# Patient Record
Sex: Female | Born: 1955 | Race: Black or African American | Hispanic: No | Marital: Single | Smoking: Former smoker
Health system: Southern US, Community
[De-identification: ages and names within clinical notes are randomized; demographics above are authoritative.]

## PROBLEM LIST (undated history)

## (undated) ENCOUNTER — Ambulatory Visit (HOSPITAL_COMMUNITY): Admission: EM

## (undated) DIAGNOSIS — T783XXA Angioneurotic edema, initial encounter: Secondary | ICD-10-CM

## (undated) DIAGNOSIS — L509 Urticaria, unspecified: Secondary | ICD-10-CM

## (undated) HISTORY — PX: BACK SURGERY: SHX140

## (undated) HISTORY — DX: Angioneurotic edema, initial encounter: T78.3XXA

## (undated) HISTORY — DX: Urticaria, unspecified: L50.9

---

## 1997-08-03 ENCOUNTER — Other Ambulatory Visit: Admission: RE | Admit: 1997-08-03 | Discharge: 1997-08-03 | Payer: Self-pay | Admitting: Obstetrics and Gynecology

## 1997-09-02 ENCOUNTER — Other Ambulatory Visit: Admission: RE | Admit: 1997-09-02 | Discharge: 1997-09-02 | Payer: Self-pay | Admitting: Obstetrics and Gynecology

## 1997-10-24 ENCOUNTER — Emergency Department (HOSPITAL_COMMUNITY): Admission: EM | Admit: 1997-10-24 | Discharge: 1997-10-24 | Payer: Self-pay | Admitting: Emergency Medicine

## 1998-04-09 ENCOUNTER — Encounter: Payer: Self-pay | Admitting: Neurosurgery

## 1998-04-09 ENCOUNTER — Ambulatory Visit (HOSPITAL_COMMUNITY): Admission: RE | Admit: 1998-04-09 | Discharge: 1998-04-09 | Payer: Self-pay | Admitting: Neurosurgery

## 1998-10-19 ENCOUNTER — Other Ambulatory Visit: Admission: RE | Admit: 1998-10-19 | Discharge: 1998-10-19 | Payer: Self-pay | Admitting: Gynecology

## 1999-10-28 ENCOUNTER — Other Ambulatory Visit: Admission: RE | Admit: 1999-10-28 | Discharge: 1999-10-28 | Payer: Self-pay | Admitting: Gynecology

## 2001-01-25 ENCOUNTER — Other Ambulatory Visit: Admission: RE | Admit: 2001-01-25 | Discharge: 2001-01-25 | Payer: Self-pay | Admitting: Gynecology

## 2002-01-15 ENCOUNTER — Encounter: Payer: Self-pay | Admitting: *Deleted

## 2002-01-15 ENCOUNTER — Encounter: Admission: RE | Admit: 2002-01-15 | Discharge: 2002-01-15 | Payer: Self-pay | Admitting: *Deleted

## 2005-11-07 ENCOUNTER — Other Ambulatory Visit: Admission: RE | Admit: 2005-11-07 | Discharge: 2005-11-07 | Payer: Self-pay | Admitting: Family Medicine

## 2005-11-18 ENCOUNTER — Emergency Department (HOSPITAL_COMMUNITY): Admission: EM | Admit: 2005-11-18 | Discharge: 2005-11-18 | Payer: Self-pay | Admitting: Emergency Medicine

## 2008-05-11 ENCOUNTER — Emergency Department (HOSPITAL_COMMUNITY): Admission: EM | Admit: 2008-05-11 | Discharge: 2008-05-11 | Payer: Self-pay | Admitting: Emergency Medicine

## 2008-10-14 ENCOUNTER — Other Ambulatory Visit: Admission: RE | Admit: 2008-10-14 | Discharge: 2008-10-14 | Payer: Self-pay | Admitting: Family Medicine

## 2009-02-04 ENCOUNTER — Encounter: Admission: RE | Admit: 2009-02-04 | Discharge: 2009-02-04 | Payer: Self-pay | Admitting: Family Medicine

## 2010-08-22 LAB — POCT CARDIAC MARKERS
CKMB, poc: 1.1 ng/mL (ref 1.0–8.0)
Myoglobin, poc: 54 ng/mL (ref 12–200)

## 2011-11-07 ENCOUNTER — Ambulatory Visit (INDEPENDENT_AMBULATORY_CARE_PROVIDER_SITE_OTHER): Payer: BC Managed Care – PPO | Admitting: Internal Medicine

## 2011-11-07 VITALS — BP 120/68 | HR 87 | Temp 99.0°F | Resp 16 | Ht 66.5 in | Wt 141.2 lb

## 2011-11-07 DIAGNOSIS — R5381 Other malaise: Secondary | ICD-10-CM

## 2011-11-07 DIAGNOSIS — R42 Dizziness and giddiness: Secondary | ICD-10-CM

## 2011-11-07 DIAGNOSIS — R5383 Other fatigue: Secondary | ICD-10-CM

## 2011-11-07 DIAGNOSIS — R634 Abnormal weight loss: Secondary | ICD-10-CM

## 2011-11-07 LAB — POCT CBC
Granulocyte percent: 58.4 %G (ref 37–80)
HCT, POC: 48.9 % — AB (ref 37.7–47.9)
Hemoglobin: 15.2 g/dL (ref 12.2–16.2)
Lymph, poc: 1.9 (ref 0.6–3.4)
MPV: 11.5 fL (ref 0–99.8)
POC Granulocyte: 3.6 (ref 2–6.9)
POC MID %: 11.5 %M (ref 0–12)
RBC: 5.69 M/uL — AB (ref 4.04–5.48)

## 2011-11-07 LAB — POCT URINALYSIS DIPSTICK
Glucose, UA: NEGATIVE
Ketones, UA: 80
Leukocytes, UA: NEGATIVE
Nitrite, UA: NEGATIVE

## 2011-11-07 LAB — COMPREHENSIVE METABOLIC PANEL
AST: 25 U/L (ref 0–37)
Albumin: 5.1 g/dL (ref 3.5–5.2)
Alkaline Phosphatase: 68 U/L (ref 39–117)
BUN: 17 mg/dL (ref 6–23)
Calcium: 10.3 mg/dL (ref 8.4–10.5)
Chloride: 96 mEq/L (ref 96–112)
Glucose, Bld: 90 mg/dL (ref 70–99)
Potassium: 3.7 mEq/L (ref 3.5–5.3)
Sodium: 136 mEq/L (ref 135–145)
Total Protein: 8.9 g/dL — ABNORMAL HIGH (ref 6.0–8.3)

## 2011-11-07 LAB — POCT UA - MICROSCOPIC ONLY

## 2011-11-07 NOTE — Progress Notes (Signed)
Subjective:    Patient ID: Brenda Douglas, female    DOB: [birth year], 56 y.o.   MRN: [id number]  HPI Patient has been fatigued for two days, back ache that flares up.  No cough or sore throat.  No abdomen pain.  No burning with urination or frequency. No blood in bowel movements.  No swelling in legs.  No hypertension or diabetes.  Standing up makes the dizziness worse.  No eye sight problems.  Sleeping is good.  Today was the first time she had been up due to fatigue and dizziness. She also has a history of weight loss of 15 pounds over the last 2-4 weeks. Appetite decreased just for the last 4 days.    Review of Systems No fever chills or night sweats No vision trouble No chest pain or palpitations No nausea or vomiting/no diarrhea or constipation No urinary symptoms/no history hematuria No bone joint complaints-history of backache over the entire back flares for many years/works as a machinist No history of chronic headaches Recent stressors involving her family which have weighed heavily on her mind/she feels stressed    Objective:   Physical ExamVital signs normal In no acute distress/well-developed well-nourished Tm 's clear and canals  clear Eyes are not injected/Pupils equal round reactive to light and accommodation. Extraocular movements normal. Nose is clear Throat is clear No lymphadnopathy Anteriorly No thyroid enlargement Or nodules 1 plus pc nodes Slightly tender high in the pc chain Neck good range of motion Heart regular without murmur carotids normal Abdomen soft nontender without organomegaly Extremities without edema Romberg negative/finger to nose intact/gait normal    Results for orders placed in visit on 11/07/11  POCT CBC      Component Value Range   WBC 6.2  4.6 - 10.2 K/uL   Lymph, poc 1.9  0.6 - 3.4   POC LYMPH PERCENT 30.1  10 - 50 %L   MID (cbc) 0.7  0 - 0.9   POC MID % 11.5  0 - 12 %M   POC Granulocyte 3.6  2 - 6.9   Granulocyte percent  58.4  37 - 80 %G   RBC 5.69 (*) 4.04 - 5.48 M/uL   Hemoglobin 15.2  12.2 - 16.2 g/dL   HCT, POC 40.9 (*) 81.1 - 47.9 %   MCV 85.9  80 - 97 fL   MCH, POC 26.7 (*) 27 - 31.2 pg   MCHC 31.1 (*) 31.8 - 35.4 g/dL   RDW, POC 91.4     Platelet Count, POC 192  142 - 424 K/uL   MPV 11.5  0 - 99.8 fL  POCT URINALYSIS DIPSTICK      Component Value Range   Color, UA yellow     Clarity, UA clear     Glucose, UA negative     Bilirubin, UA small     Ketones, UA 80     Spec Grav, UA >=1.030     Blood, UA large     pH, UA 5.5     Protein, UA 300     Urobilinogen, UA 1.0     Nitrite, UA negative     Leukocytes, UA Negative    POCT UA - MICROSCOPIC ONLY      Component Value Range   WBC, Ur, HPF, POC 1-2     RBC, urine, microscopic 3-6     Bacteria, U Microscopic trace     Mucus, UA trace     Epithelial cells, urine per micros  0-1     Crystals, Ur, HPF, POC negative     Casts, Ur, LPF, POC positive     Yeast, UA negative         Assessment & Plan:   1. Fatigue  POCT CBC, POCT urinalysis dipstick, POCT UA - Microscopic Only, POCT SEDIMENTATION RATE, Comprehensive metabolic panel, TSH  2. Dizziness  POCT CBC, POCT urinalysis dipstick, POCT UA - Microscopic Only, POCT SEDIMENTATION RATE, Comprehensive metabolic panel, TSH  3. Weight loss, abnormal  POCT CBC, POCT urinalysis dipstick, POCT UA - Microscopic Only, POCT SEDIMENTATION RATE, Comprehensive metabolic panel, TSH  4.  Hematuria      Will recheck at subsequent visit  For now I recommend only rest while labs pending/out of work for 3 days

## 2011-11-09 ENCOUNTER — Telehealth: Payer: Self-pay

## 2011-11-09 NOTE — Telephone Encounter (Signed)
Pt was given results by Lab today at 12:45

## 2011-11-09 NOTE — Telephone Encounter (Signed)
Pt states is returning phone call from the lab.  She is looking for her results.  [phone]

## 2011-11-10 ENCOUNTER — Ambulatory Visit (INDEPENDENT_AMBULATORY_CARE_PROVIDER_SITE_OTHER): Payer: BC Managed Care – PPO | Admitting: Internal Medicine

## 2011-11-10 VITALS — BP 132/70 | HR 61 | Temp 98.0°F | Resp 18 | Ht 66.0 in | Wt 141.6 lb

## 2011-11-10 DIAGNOSIS — D72823 Leukemoid reaction: Secondary | ICD-10-CM

## 2011-11-10 DIAGNOSIS — E8809 Other disorders of plasma-protein metabolism, not elsewhere classified: Secondary | ICD-10-CM

## 2011-11-10 DIAGNOSIS — R5381 Other malaise: Secondary | ICD-10-CM

## 2011-11-10 DIAGNOSIS — D72820 Lymphocytosis (symptomatic): Secondary | ICD-10-CM

## 2011-11-10 DIAGNOSIS — R5383 Other fatigue: Secondary | ICD-10-CM

## 2011-11-10 LAB — POCT CBC
Granulocyte percent: 31.1 %G — AB (ref 37–80)
HCT, POC: 43.1 % (ref 37.7–47.9)
Hemoglobin: 13.7 g/dL (ref 12.2–16.2)
MCH, POC: 27.2 pg (ref 27–31.2)
MCV: 85.7 fL (ref 80–97)
Platelet Count, POC: 239 10*3/uL (ref 142–424)
RBC: 5.03 M/uL (ref 4.04–5.48)
WBC: 10 10*3/uL (ref 4.6–10.2)

## 2011-11-10 LAB — POCT URINALYSIS DIPSTICK
Ketones, UA: NEGATIVE
Leukocytes, UA: NEGATIVE
Nitrite, UA: NEGATIVE
pH, UA: 6

## 2011-11-10 LAB — COMPREHENSIVE METABOLIC PANEL
Albumin: 4.6 g/dL (ref 3.5–5.2)
Alkaline Phosphatase: 54 U/L (ref 39–117)
BUN: 11 mg/dL (ref 6–23)
CO2: 29 mEq/L (ref 19–32)
Calcium: 9.7 mg/dL (ref 8.4–10.5)
Chloride: 103 mEq/L (ref 96–112)
Glucose, Bld: 106 mg/dL — ABNORMAL HIGH (ref 70–99)
Potassium: 3.9 mEq/L (ref 3.5–5.3)
Sodium: 139 mEq/L (ref 135–145)
Total Protein: 8.1 g/dL (ref 6.0–8.3)

## 2011-11-10 LAB — POCT UA - MICROSCOPIC ONLY: Crystals, Ur, HPF, POC: NEGATIVE

## 2011-11-10 NOTE — Progress Notes (Signed)
Subjective:    Patient ID: Brenda Douglas, female    DOB: 05-10-1955, 56 y.o.   MRN: [id number]  HPI I asked her to followup for continued evaluation of her fatigue and abnormal weight loss the cause of her sedimentation rate was 49, she had hematuria, and her total protein was slightly elevated. She continues to complain of significant fatigue and can't stay and be active for more than 5 or 10 minutes without having to rest. This is a fatigue and shortness of breath. She still has no other symptoms except mild dizziness-occur when she's been up and around for a while.   Review of Systems  Constitutional: Positive for activity change, appetite change and fatigue. Negative for fever, chills, diaphoresis and unexpected weight change.  HENT: Negative for sore throat, trouble swallowing and neck stiffness.   Eyes: Negative for photophobia and visual disturbance.  Respiratory: Negative for cough and shortness of breath.   Cardiovascular: Negative for chest pain, palpitations and leg swelling.  Gastrointestinal: Negative for abdominal pain, diarrhea and constipation.  Genitourinary: Negative for dysuria, frequency, decreased urine volume and difficulty urinating.  Musculoskeletal: Positive for back pain. Negative for joint swelling and gait problem.       Continues with intermittent low back muscle pain  Skin: Negative for rash.  Neurological: Negative for headaches.  Hematological: Negative for adenopathy. Does not bruise/bleed easily.       Objective:   Physical Exam Vital signs are normal She appears fatigued but otherwise in no acute distress HEENT is clear Lungs clear Heart regular without murmur Abdomen soft nontender and nondistended with no organomegaly or masses Joints are clear Neuro is intact       Results for orders placed in visit on 11/10/11  POCT UA - MICROSCOPIC ONLY      Component Value Range   WBC, Ur, HPF, POC 0-1     RBC, urine, microscopic 0-3     Bacteria, U Microscopic trace     Mucus, UA neg     Epithelial cells, urine per micros 1-3     Crystals, Ur, HPF, POC neg     Casts, Ur, LPF, POC neg     Yeast, UA meg    POCT URINALYSIS DIPSTICK      Component Value Range   Color, UA yellow     Clarity, UA cloudy     Glucose, UA neg     Bilirubin, UA neg     Ketones, UA neg     Spec Grav, UA 1.020     Blood, UA small     pH, UA 6.0     Protein, UA trace     Urobilinogen, UA 0.2     Nitrite, UA neg     Leukocytes, UA Negative    POCT CBC      Component Value Range   WBC 10.0  4.6 - 10.2 K/uL   Lymph, poc 5.3 (*) 0.6 - 3.4   POC LYMPH PERCENT 52.6 (*) 10 - 50 %L   MID (cbc) 1.6 (*) 0 - 0.9   POC MID % 16.3 (*) 0 - 12 %M   POC Granulocyte 3.1  2 - 6.9   Granulocyte percent 31.1 (*) 37 - 80 %G   RBC 5.03  4.04 - 5.48 M/uL   Hemoglobin 13.7  12.2 - 16.2 g/dL   HCT, POC 40.9  81.1 - 47.9 %   MCV 85.7  80 - 97 fL   MCH, POC 27.2  27 -  31.2 pg   MCHC 31.8  31.8 - 35.4 g/dL   RDW, POC 78.4     Platelet Count, POC 239  142 - 424 K/uL   MPV 10.3  0 - 99.8 fL    Assessment & Plan:   1.  fatigue  POCT UA - Microscopic Only, POCT urinalysis dipstick, POCT CBC, POCT SEDIMENTATION RATE, Comprehensive metabolic panel, Epstein-Barr virus VCA, IgM, CMV IgM  2. Hyperproteinemia  Serum protein electrophoresis with reflex  3. Lymphocytosis (symptomatic)  Epstein-Barr virus VCA, IgM, CMV IgM  4.  Hematuria-resolved 5.  Dizziness /lightheadedness At this point there still no clear diagnosis-outside labs pending Continue out of work with rest

## 2011-11-13 LAB — EPSTEIN-BARR VIRUS VCA, IGM: EBV VCA IgM: 13.3 U/mL (ref <36.0)

## 2011-11-14 LAB — PROTEIN ELECTROPHORESIS, SERUM, WITH REFLEX
Albumin ELP: 53.5 % — ABNORMAL LOW (ref 55.8–66.1)
Beta 2: 4.9 % (ref 3.2–6.5)

## 2011-11-14 LAB — IGG, IGA, IGM: IgG (Immunoglobin G), Serum: 2010 mg/dL — ABNORMAL HIGH (ref 690–1700)

## 2011-11-17 ENCOUNTER — Encounter: Payer: Self-pay | Admitting: Internal Medicine

## 2011-12-01 ENCOUNTER — Encounter: Payer: Self-pay | Admitting: Internal Medicine

## 2012-01-22 ENCOUNTER — Other Ambulatory Visit (HOSPITAL_COMMUNITY)
Admission: RE | Admit: 2012-01-22 | Discharge: 2012-01-22 | Disposition: A | Discharge status: Home / Self Care | Payer: BC Managed Care – PPO | Source: Ambulatory Visit | Attending: Family Medicine | Admitting: Family Medicine

## 2012-01-22 ENCOUNTER — Other Ambulatory Visit: Payer: Self-pay | Admitting: Family Medicine

## 2012-01-22 DIAGNOSIS — Z01419 Encounter for gynecological examination (general) (routine) without abnormal findings: Secondary | ICD-10-CM | POA: Insufficient documentation

## 2013-03-28 ENCOUNTER — Ambulatory Visit (INDEPENDENT_AMBULATORY_CARE_PROVIDER_SITE_OTHER): Payer: BC Managed Care – PPO | Admitting: Emergency Medicine

## 2013-03-28 VITALS — BP 110/72 | HR 66 | Temp 99.0°F | Resp 18 | Ht 67.0 in | Wt 143.0 lb

## 2013-03-28 DIAGNOSIS — K05219 Aggressive periodontitis, localized, unspecified severity: Secondary | ICD-10-CM

## 2013-03-28 MED ORDER — HYDROCODONE-ACETAMINOPHEN 5-325 MG PO TABS: 1.0000 | ORAL_TABLET | ORAL | Status: DC | PRN

## 2013-03-28 MED ORDER — CEPHALEXIN 500 MG PO CAPS: 500.0000 mg | ORAL_CAPSULE | Freq: Four times a day (QID) | ORAL | Status: DC

## 2013-03-28 NOTE — Progress Notes (Signed)
[facility] [address], [place] [address] [id number] [phone]  Date:  03/28/2013   Name:  DEANNDRA KIRLEY   DOB:  February 19, 1956   MRN:  [id number]  PCP:  No primary provider on file.    Chief Complaint: Dental Pain   History of Present Illness:  MARIETTE COWLEY is a 57 y.o. very pleasant female patient who presents with the following:  Last night developed pain in the left mandible with swelling.  Thinks she has an abscess.  Pain is in the "back of her mouth".  No fever or chills. No nausea or vomiting No improvement with over the counter medications or other home remedies.  Denies other complaint or health concern today.  Has appt with dentist next week.  There are no active problems to display for this patient.   History reviewed. No pertinent past medical history.  History reviewed. No pertinent past surgical history.  History  Substance Use Topics  . Smoking status: Former Games developer  . Smokeless tobacco: Not on file  . Alcohol Use: Not on file    History reviewed. No pertinent family history.  No Known Allergies  Medication list has been reviewed and updated.  Current Outpatient Prescriptions on File Prior to Visit  Medication Sig Dispense Refill  . ibuprofen (ADVIL,MOTRIN) 200 MG tablet Take 200 mg by mouth every 6 (six) hours as needed.       No current facility-administered medications on file prior to visit.    Review of Systems:  As per HPI, otherwise negative.    Physical Examination: Filed Vitals:   03/28/13 0952  BP: 110/72  Pulse: 66  Temp: 99 F (37.2 C)  Resp: 18   Filed Vitals:   03/28/13 0952  Height: 5\' 7"  (1.702 m)  Weight: 143 lb (64.864 kg)   Body mass index is 22.39 kg/(m^2). Ideal Body Weight: Weight in (lb) to have BMI = 25: 159.3   GEN: WDWN, NAD, Non-toxic, Alert & Oriented x 3 HEENT: Atraumatic, Normocephalic.  Ears and Nose: No external deformity.  TM negative MOUTH:  #20 is decayed to the gum and  #18 is very tender with no visible abscess EXTR: No clubbing/cyanosis/edema NEURO: Normal gait.  PSYCH: Normally interactive. Conversant. Not depressed or anxious appearing.  Calm demeanor.    Assessment and Plan: Periodontal abscess Keflex Dental follow up  vicodin  Signed,  Phillips Odor, MD

## 2013-03-28 NOTE — Patient Instructions (Signed)
Dental Dental Abscess A dental abscess is a collection of infected fluid (pus) from a bacterial infection in the inner part of the tooth (pulp). It usually occurs at the end of the tooth's root.  CAUSES   Severe tooth decay.  Trauma to the tooth that allows bacteria to enter into the pulp, such as a broken or chipped tooth. SYMPTOMS   Severe pain in and around the infected tooth.  Swelling and redness around the abscessed tooth or in the mouth or face.  Tenderness.  Pus drainage.  Bad breath.  Bitter taste in the mouth.  Difficulty swallowing.  Difficulty opening the mouth.  Nausea.  Vomiting.  Chills.  Swollen neck glands. DIAGNOSIS   A medical and dental history will be taken.  An examination will be performed by tapping on the abscessed tooth.  X-rays may be taken of the tooth to identify the abscess. TREATMENT The goal of treatment is to eliminate the infection. You may be prescribed antibiotic medicine to stop the infection from spreading. A root canal may be performed to save the tooth. If the tooth cannot be saved, it may be pulled (extracted) and the abscess may be drained.  HOME CARE INSTRUCTIONS  Only take over-the-counter or prescription medicines for pain, fever, or discomfort as directed by your caregiver.  Rinse your mouth (gargle) often with salt water ( tsp salt in 8 oz [250 ml] of warm water) to relieve pain or swelling.  Do not drive after taking pain medicine (narcotics).  Do not apply heat to the outside of your face.  Return to your dentist for further treatment as directed. SEEK MEDICAL CARE IF:  Your pain is not helped by medicine.  Your pain is getting worse instead of better. SEEK IMMEDIATE MEDICAL CARE IF:  You have a fever or persistent symptoms for more than 2 3 days.  You have a fever and your symptoms suddenly get worse.  You have chills or a very bad headache.  You have problems breathing or swallowing.  You have  trouble opening your mouth.  You have swelling in the neck or around the eye. Document Released: 04/24/2005 Document Revised: 01/17/2012 Document Reviewed: 08/02/2010 [facility] Patient Information 2014 [place], [place].

## 2014-12-02 ENCOUNTER — Ambulatory Visit (INDEPENDENT_AMBULATORY_CARE_PROVIDER_SITE_OTHER): Payer: BLUE CROSS/BLUE SHIELD | Admitting: Family Medicine

## 2014-12-02 VITALS — BP 126/76 | HR 72 | Temp 97.8°F | Resp 16 | Ht 66.0 in | Wt 155.4 lb

## 2014-12-02 DIAGNOSIS — S161XXA Strain of muscle, fascia and tendon at neck level, initial encounter: Secondary | ICD-10-CM

## 2014-12-02 MED ORDER — MELOXICAM 15 MG PO TABS: 15.0000 mg | ORAL_TABLET | Freq: Every day | ORAL | Status: DC

## 2014-12-02 MED ORDER — HYDROCODONE-ACETAMINOPHEN 5-325 MG PO TABS: 1.0000 | ORAL_TABLET | ORAL | Status: DC | PRN

## 2014-12-02 MED ORDER — CYCLOBENZAPRINE HCL 10 MG PO TABS: 10.0000 mg | ORAL_TABLET | Freq: Three times a day (TID) | ORAL | Status: DC | PRN

## 2014-12-02 NOTE — Progress Notes (Signed)
Subjective:    Patient ID: Brenda Douglas, female    DOB: May 24, 1955, 59 y.o.   MRN: [id number]  HPI This is a very pleasant 59 yo female who presents today with neck and shoulder pain that started when she awoke 6 days ago. She denies any unusual lifting/pushing or pulling. No falls. Feels like previous muscle strain. Has tried flexeril TID for the last 4-5 days. No NSAIDS. No heat or ice. No relief with hot shower.   History reviewed. No pertinent past medical history. History reviewed. No pertinent past surgical history. Family History  Problem Relation Age of Onset  . Diabetes Mother   . Diabetes Brother    History  Substance Use Topics  . Smoking status: Former Games developer  . Smokeless tobacco: Never Used  . Alcohol Use: No  Medications, allergies, past medical history, surgical history, family history, social history and problem list reviewed and updated.   Review of Systems Pain radiates to right arm, no tingling or weakness, some numbness in right hand. No pain in buttocks or legs. No headache. No bowel or bladder changes. No CP or SOB. Has been holding head still and limiting activity since pain started.    Objective:   Physical Exam  Constitutional: She is oriented to person, place, and time. She appears well-developed and well-nourished.  Appears mildly uncomfortable, holding head very still.  HENT:  Head: Normocephalic and atraumatic.  Right Ear: Tympanic membrane, external ear and ear canal normal.  Left Ear: Tympanic membrane, external ear and ear canal normal.  Nose: Nose normal.  Mouth/Throat: No oropharyngeal exudate.  Eyes: Conjunctivae and EOM are normal. Pupils are equal, round, and reactive to light. Right eye exhibits no discharge. Left eye exhibits no discharge.  Neck: Spinous process tenderness and muscular tenderness present. No rigidity. Decreased range of motion present. No edema and no erythema present. No Brudzinski's sign and no Kernig's sign noted.  No thyromegaly present.  Generalized decreased ROM and spinous process and muscular tenderness. Pain with raising arms.   Cardiovascular: Normal rate, regular rhythm and normal heart sounds.   Pulmonary/Chest: Effort normal and breath sounds normal.  Lymphadenopathy:    She has no cervical adenopathy.  Neurological: She is alert and oriented to person, place, and time. She has normal reflexes. No cranial nerve deficit.  Skin: Skin is warm and dry.  Psychiatric: She has a normal mood and affect. Her behavior is normal. Judgment and thought content normal.  Vitals reviewed.  BP 126/76 mmHg  Pulse 72  Temp(Src) 97.8 F (36.6 C) (Oral)  Resp 16  Ht 5\' 6"  (1.676 m)  Wt 155 lb 6.4 oz (70.489 kg)  BMI 25.09 kg/m2  SpO2 98%     Assessment & Plan:  1. Cervical strain, initial encounter - Provided written and verbal information regarding diagnosis and treatment. - encouraged her to do ROM multiple times a day while awake, to apply moist heat - cyclobenzaprine (FLEXERIL) 10 MG tablet; Take 1 tablet (10 mg total) by mouth 3 (three) times daily as needed for muscle spasms.  Dispense: 30 tablet; Refill: 0 - HYDROcodone-acetaminophen (NORCO) 5-325 MG per tablet; Take 1 tablet by mouth every 4 (four) hours as needed for moderate pain.  Dispense: 15 tablet; Refill: 0. Discussed potential for dependence and encouraged her to use sparingly. - meloxicam (MOBIC) 15 MG tablet; Take 1 tablet (15 mg total) by mouth daily.  Dispense: 30 tablet; Refill: 0 - OOW until 12/07/14 - RTC/ER immediately if she develops worsening  pain, nausea/vominting, fever/chills, weakness, severe headache.  - follow up if no improvement in 3-4 days  Olean Ree, FNP-BC  [facility]  12/02/2014 9:50 PM

## 2014-12-02 NOTE — Patient Instructions (Signed)
Take cyclobenzaprine for muscle spasms Take meloxicam for 5-7 days then as needed. It is an antiinflammatory Norco is for severe pain- use sparingly.   Cervical Sprain A cervical sprain is an injury in the neck in which the strong, fibrous tissues (ligaments) that connect your neck bones stretch or tear. Cervical sprains can range from mild to severe. Severe cervical sprains can cause the neck vertebrae to be unstable. This can lead to damage of the spinal cord and can result in serious nervous system problems. The amount of time it takes for a cervical sprain to get better depends on the cause and extent of the injury. Most cervical sprains heal in 1 to 3 weeks. CAUSES  Severe cervical sprains may be caused by:   Contact sport injuries (such as from football, rugby, wrestling, hockey, auto racing, gymnastics, diving, martial arts, or boxing).   Motor vehicle collisions.   Whiplash injuries. This is an injury from a sudden forward and backward whipping movement of the head and neck.  Falls.  Mild cervical sprains may be caused by:   Being in an awkward position, such as while cradling a telephone between your ear and shoulder.   Sitting in a chair that does not offer proper support.   Working at a poorly Marketing executive station.   Looking up or down for long periods of time.  SYMPTOMS   Pain, soreness, stiffness, or a burning sensation in the front, back, or sides of the neck. This discomfort may develop immediately after the injury or slowly, 24 hours or more after the injury.   Pain or tenderness directly in the middle of the back of the neck.   Shoulder or upper back pain.   Limited ability to move the neck.   Headache.   Dizziness.   Weakness, numbness, or tingling in the hands or arms.   Muscle spasms.   Difficulty swallowing or chewing.   Tenderness and swelling of the neck.  DIAGNOSIS  Most of the time your health care provider can diagnose a  cervical sprain by taking your history and doing a physical exam. Your health care provider will ask about previous neck injuries and any known neck problems, such as arthritis in the neck. X-rays may be taken to find out if there are any other problems, such as with the bones of the neck. Other tests, such as a CT scan or MRI, may also be needed.  TREATMENT  Treatment depends on the severity of the cervical sprain. Mild sprains can be treated with rest, keeping the neck in place (immobilization), and pain medicines. Severe cervical sprains are immediately immobilized. Further treatment is done to help with pain, muscle spasms, and other symptoms and may include:  Medicines, such as pain relievers, numbing medicines, or muscle relaxants.   Physical therapy. This may involve stretching exercises, strengthening exercises, and posture training. Exercises and improved posture can help stabilize the neck, strengthen muscles, and help stop symptoms from returning.  HOME CARE INSTRUCTIONS   Put ice on the injured area.   Put ice in a plastic bag.   Place a towel between your skin and the bag.   Leave the ice on for 15-20 minutes, 3-4 times a day.   If your injury was severe, you may have been given a cervical collar to wear. A cervical collar is a two-piece collar designed to keep your neck from moving while it heals.  Do not remove the collar unless instructed by your health care provider.  If you have long hair, keep it outside of the collar.  Ask your health care provider before making any adjustments to your collar. Minor adjustments may be required over time to improve comfort and reduce pressure on your chin or on the back of your head.  Ifyou are allowed to remove the collar for cleaning or bathing, follow your health care provider's instructions on how to do so safely.  Keep your collar clean by wiping it with mild soap and water and drying it completely. If the collar you have  been given includes removable pads, remove them every 1-2 days and hand wash them with soap and water. Allow them to air dry. They should be completely dry before you wear them in the collar.  If you are allowed to remove the collar for cleaning and bathing, wash and dry the skin of your neck. Check your skin for irritation or sores. If you see any, tell your health care provider.  Do not drive while wearing the collar.   Only take over-the-counter or prescription medicines for pain, discomfort, or fever as directed by your health care provider.   Keep all follow-up appointments as directed by your health care provider.   Keep all physical therapy appointments as directed by your health care provider.   Make any needed adjustments to your workstation to promote good posture.   Avoid positions and activities that make your symptoms worse.   Warm up and stretch before being active to help prevent problems.  SEEK MEDICAL CARE IF:   Your pain is not controlled with medicine.   You are unable to decrease your pain medicine over time as planned.   Your activity level is not improving as expected.  SEEK IMMEDIATE MEDICAL CARE IF:   You develop any bleeding.  You develop stomach upset.  You have signs of an allergic reaction to your medicine.   Your symptoms get worse.   You develop new, unexplained symptoms.   You have numbness, tingling, weakness, or paralysis in any part of your body.  MAKE SURE YOU:   Understand these instructions.  Will watch your condition.  Will get help right away if you are not doing well or get worse. Document Released: 02/19/2007 Document Revised: 04/29/2013 Document Reviewed: 10/30/2012 [facility] Patient Information 2015 [place], [place]. This information is not intended to replace advice given to you by your health care provider. Make sure you discuss any questions you have with your health care provider.

## 2015-09-25 ENCOUNTER — Emergency Department (HOSPITAL_COMMUNITY)
Admission: EM | Admit: 2015-09-25 | Discharge: 2015-09-25 | Disposition: A | Discharge status: Home / Self Care | Payer: BLUE CROSS/BLUE SHIELD | Source: Home / Self Care | Attending: Emergency Medicine | Admitting: Emergency Medicine

## 2015-09-25 ENCOUNTER — Encounter (HOSPITAL_COMMUNITY): Payer: Self-pay | Admitting: *Deleted

## 2015-09-25 DIAGNOSIS — M5412 Radiculopathy, cervical region: Secondary | ICD-10-CM | POA: Diagnosis not present

## 2015-09-25 DIAGNOSIS — Z87891 Personal history of nicotine dependence: Secondary | ICD-10-CM | POA: Insufficient documentation

## 2015-09-25 DIAGNOSIS — Z79891 Long term (current) use of opiate analgesic: Secondary | ICD-10-CM | POA: Insufficient documentation

## 2015-09-25 DIAGNOSIS — M546 Pain in thoracic spine: Secondary | ICD-10-CM

## 2015-09-25 DIAGNOSIS — M62830 Muscle spasm of back: Secondary | ICD-10-CM | POA: Diagnosis not present

## 2015-09-25 DIAGNOSIS — M549 Dorsalgia, unspecified: Secondary | ICD-10-CM

## 2015-09-25 MED ORDER — KETOROLAC TROMETHAMINE 60 MG/2ML IM SOLN
60.0000 mg | Freq: Once | INTRAMUSCULAR | Status: AC
  Administered 2015-09-25: 60 mg via INTRAMUSCULAR
  Filled 2015-09-25: qty 2

## 2015-09-25 MED ORDER — METHOCARBAMOL 500 MG PO TABS
500.0000 mg | ORAL_TABLET | Freq: Once | ORAL | Status: AC
  Administered 2015-09-25: 500 mg via ORAL
  Filled 2015-09-25: qty 1

## 2015-09-25 MED ORDER — METHOCARBAMOL 500 MG PO TABS: 500.0000 mg | ORAL_TABLET | Freq: Two times a day (BID) | ORAL | Status: DC

## 2015-09-25 MED ORDER — NAPROXEN 500 MG PO TABS: 500.0000 mg | ORAL_TABLET | Freq: Two times a day (BID) | ORAL | Status: DC

## 2015-09-25 NOTE — ED Provider Notes (Signed)
CSN: [id number]     Arrival date & time 09/25/15  0902 History  By signing my name below, I, Brenda Douglas, attest that this documentation has been prepared under the direction and in the presence of Sharilyn Sites, PA-C. Electronically Signed: Angelene Giovanni, ED Scribe. 09/25/2015. 9:34 AM.    Chief Complaint  Patient presents with  . Back Pain   The history is provided by the patient. No language interpreter was used.   HPI Comments: Brenda Douglas is a 60 y.o. female who presents to the Emergency Department complaining of gradually worsening moderate upper back pain onset 3 days ago. She explains that raising her left arm above her head makes the pain worse. She denies any recent falls, injuries, or trauma. Pt was seen at Urgent Care 2 days ago where she received pain medication IM and discharged with Meloxicam and Flexeril. She states that she is here today because her Meloxicam and Flexeril normally resolves her symptoms but they are not alleviating her symptoms today. No other alleviating factors noted. She reports NKDA. No fever, chills, or rash. Denies any chest pain or shortness of breath.   History reviewed. No pertinent past medical history. Past Surgical History  Procedure Laterality Date  . Back surgery     Family History  Problem Relation Age of Onset  . Diabetes Mother   . Diabetes Brother    Social History  Substance Use Topics  . Smoking status: Former Games developer  . Smokeless tobacco: Never Used  . Alcohol Use: No   OB History    No data available     Review of Systems  Constitutional: Negative for fever and chills.  Musculoskeletal: Positive for back pain.  Skin: Negative for rash.  All other systems reviewed and are negative.     Allergies  Review of patient's allergies indicates no known allergies.  Home Medications   Prior to Admission medications   Medication Sig Start Date End Date Taking? Authorizing Provider  cyclobenzaprine (FLEXERIL) 10  MG tablet Take 1 tablet (10 mg total) by mouth 3 (three) times daily as needed for muscle spasms. 12/02/14   Emi Belfast, FNP  HYDROcodone-acetaminophen (NORCO) 5-325 MG per tablet Take 1 tablet by mouth every 4 (four) hours as needed for moderate pain. 12/02/14   Emi Belfast, FNP  meloxicam (MOBIC) 15 MG tablet Take 1 tablet (15 mg total) by mouth daily. 12/02/14   Emi Belfast, FNP   BP 135/86 mmHg  Pulse 82  Temp(Src) 98 F (36.7 C) (Oral)  Resp 20  SpO2 100%   Physical Exam  Constitutional: She is oriented to person, place, and time. She appears well-developed and well-nourished.  HENT:  Head: Normocephalic and atraumatic.  Mouth/Throat: Oropharynx is clear and moist.  Eyes: Conjunctivae and EOM are normal. Pupils are equal, round, and reactive to light.  Neck: Normal range of motion.  Cardiovascular: Normal rate, regular rhythm and normal heart sounds.   Pulmonary/Chest: Effort normal and breath sounds normal.  Abdominal: Soft. Bowel sounds are normal.  Musculoskeletal: Normal range of motion.  No midline spinal tenderness or deformities, no step-off Tenderness and spasm of left trapezius with extension into left shoulder, pain with range of motion, specifically raising left arm above head, normal grip strength, strong radial pulse and cap refill, normal sensation throughout arm  Neurological: She is alert and oriented to person, place, and time.  Skin: Skin is warm and dry.  Psychiatric: She has a normal mood and affect.  Nursing note and vitals reviewed.   ED Course  Procedures (including critical care time) DIAGNOSTIC STUDIES: Oxygen Saturation is 100% on RA, normal by my interpretation.    COORDINATION OF CARE: 9:33 AM- Pt advised of plan for treatment and pt agrees. Pt will receive Toradol and Robaxin.   MDM   Final diagnoses:  Back pain, unspecified location   60 year old female here with left upper back pain that has been ongoing for the past 4  days. Has been seen at urgent care twice for the same. Reports no relief from medications given. On exam she has tenderness and spasm of her left trapezius with extension into her left shoulder. She has pain with range of motion of the left arm but no focal neurologic deficits. No midline deformities or step-off.  No trauma or noted injury.  Suspect muscle spasm.  Reports history of same. No chest pain or SOB. Patient treated in the ED with Toradol and Robaxin. Will change home meds to robaxin, naprosyn.  Advised not take these at the same time as meloxicam and flexeril.  FU with PCP.  Discussed plan with patient, he/she acknowledged understanding and agreed with plan of care.  Return precautions given for new or worsening symptoms.  I personally performed the services described in this documentation, which was scribed in my presence. The recorded information has been reviewed and is accurate.  Garlon HatchetLisa M Zlatan Hornback, PA-C 09/25/15 1204  Mancel BaleElliott Wentz, MD 09/25/15 [phone]

## 2015-09-25 NOTE — Discharge Instructions (Signed)
Take the prescribed medication as directed.  Do not take these at the same time as meloxicam and flexeril-- take one or the other. Follow-up with your primary care doctor. Return to the ED for new or worsening symptoms.

## 2015-09-25 NOTE — ED Notes (Signed)
Pt reports pain to upper back unrelieved since WED.Marland Kitchen. Pt reports she went to an urgent on Thursday and was given a shot and Pt returned to urgent care on Friday for another shot. Pt reports pain was in lower back and now hurts in Lt upper back and Lt arm.

## 2015-09-25 NOTE — ED Notes (Signed)
Declined W/C at D/C and was escorted to lobby by RN. 

## 2015-09-26 ENCOUNTER — Emergency Department (HOSPITAL_COMMUNITY): Payer: BLUE CROSS/BLUE SHIELD

## 2015-09-26 ENCOUNTER — Emergency Department (HOSPITAL_COMMUNITY)
Admission: EM | Admit: 2015-09-26 | Discharge: 2015-09-26 | Disposition: A | Discharge status: Home / Self Care | Payer: BLUE CROSS/BLUE SHIELD | Attending: Emergency Medicine | Admitting: Emergency Medicine

## 2015-09-26 ENCOUNTER — Encounter (HOSPITAL_COMMUNITY): Payer: Self-pay | Admitting: Adult Health

## 2015-09-26 DIAGNOSIS — M62830 Muscle spasm of back: Secondary | ICD-10-CM

## 2015-09-26 DIAGNOSIS — M5412 Radiculopathy, cervical region: Secondary | ICD-10-CM

## 2015-09-26 LAB — I-STAT TROPONIN, ED: Troponin i, poc: 0 ng/mL (ref 0.00–0.08)

## 2015-09-26 LAB — BASIC METABOLIC PANEL
Anion gap: 10 (ref 5–15)
BUN: 12 mg/dL (ref 6–20)
CHLORIDE: 101 mmol/L (ref 101–111)
CO2: 27 mmol/L (ref 22–32)
Calcium: 9.5 mg/dL (ref 8.9–10.3)
Creatinine, Ser: 0.73 mg/dL (ref 0.44–1.00)
GFR calc Af Amer: >60 mL/min (ref >60)
GFR calc non Af Amer: >60 mL/min (ref >60)
Glucose, Bld: 105 mg/dL — ABNORMAL HIGH (ref 65–99)
POTASSIUM: 3.9 mmol/L (ref 3.5–5.1)
SODIUM: 138 mmol/L (ref 135–145)

## 2015-09-26 LAB — CBC
HEMATOCRIT: 37.8 % (ref 36.0–46.0)
Hemoglobin: 11.9 g/dL — ABNORMAL LOW (ref 12.0–15.0)
MCH: 25.8 pg — ABNORMAL LOW (ref 26.0–34.0)
MCHC: 31.5 g/dL (ref 30.0–36.0)
MCV: 82 fL (ref 78.0–100.0)
Platelets: 297 10*3/uL (ref 150–400)
RBC: 4.61 MIL/uL (ref 3.87–5.11)
RDW: 13.2 % (ref 11.5–15.5)
WBC: 8.5 10*3/uL (ref 4.0–10.5)

## 2015-09-26 MED ORDER — HYDROMORPHONE HCL 1 MG/ML IJ SOLN
0.5000 mg | Freq: Once | INTRAMUSCULAR | Status: AC
  Administered 2015-09-26: 0.5 mg via INTRAVENOUS
  Filled 2015-09-26: qty 1

## 2015-09-26 MED ORDER — KETOROLAC TROMETHAMINE 30 MG/ML IJ SOLN
30.0000 mg | Freq: Once | INTRAMUSCULAR | Status: AC
  Administered 2015-09-26: 30 mg via INTRAVENOUS
  Filled 2015-09-26: qty 1

## 2015-09-26 MED ORDER — DEXAMETHASONE SODIUM PHOSPHATE 10 MG/ML IJ SOLN
10.0000 mg | Freq: Once | INTRAMUSCULAR | Status: AC
  Administered 2015-09-26: 10 mg via INTRAVENOUS
  Filled 2015-09-26: qty 1

## 2015-09-26 MED ORDER — ONDANSETRON HCL 4 MG/2ML IJ SOLN
4.0000 mg | Freq: Once | INTRAMUSCULAR | Status: AC
  Administered 2015-09-26: 4 mg via INTRAVENOUS
  Filled 2015-09-26: qty 2

## 2015-09-26 MED ORDER — DIAZEPAM 5 MG/ML IJ SOLN
5.0000 mg | Freq: Once | INTRAMUSCULAR | Status: AC
  Administered 2015-09-26: 5 mg via INTRAVENOUS
  Filled 2015-09-26: qty 2

## 2015-09-26 MED ORDER — OXYCODONE-ACETAMINOPHEN 5-325 MG PO TABS: 1.0000 | ORAL_TABLET | ORAL | Status: DC | PRN

## 2015-09-26 NOTE — ED Notes (Signed)
Presents with left shoulder pain that goes into left and right chest and down left arm and all the way down left side of back. Described as intense-movement makes pain worse. Pain is not relieved with muscle relaxers or pain medications. Lying down makes pain worse and pt endorses SOB when lying down. She was treated here 5[facility]20 [facility]

## 2015-09-26 NOTE — Discharge Instructions (Signed)
Cervical Radiculopathy Cervical radiculopathy means that a nerve in the neck is pinched or bruised. This can cause pain or loss of feeling (numbness) that runs from your neck to your arm and fingers. HOME CARE Managing Pain  Take over-the-counter and prescription medicines only as told by your doctor.  If directed, put ice on the injured or painful area.  Put ice in a plastic bag.  Place a towel between your skin and the bag.  Leave the ice on for 20 minutes, 2-3 times per day.  If ice does not help, you can try using heat. Take a warm shower or warm bath, or use a heat pack as told by your doctor.  You may try a gentle neck and shoulder massage. Activity  Rest as needed. Follow instructions from your doctor about any activities to avoid.  Do exercises as told by your doctor or physical therapist. General Instructions   If you were given a soft collar, wear it as told by your doctor.  Use a flat pillow when you sleep.  Keep all follow-up visits as told by your doctor. This is important. GET HELP IF:  Your condition does not improve with treatment. GET HELP RIGHT AWAY IF:   Your pain gets worse and is not controlled with medicine.  You lose feeling or feel weak in your hand, arm, face, or leg.  You have a fever.  You have a stiff neck.  You cannot control when you poop or pee (have incontinence).  You have trouble with walking, balance, or talking.   This information is not intended to replace advice given to you by your health care provider. Make sure you discuss any questions you have with your health care provider.   Document Released: 04/13/2011 Document Revised: 01/13/2015 Document Reviewed: 06/18/2014 Elsevier Interactive Patient Education 2016 [employer]  Muscle Cramps and Spasms Muscle cramps and spasms occur when a muscle or muscles tighten and you have no control over this tightening (involuntary muscle contraction). They are a common problem and can  develop in any muscle. The most common place is in the calf muscles of the leg. Both muscle cramps and muscle spasms are involuntary muscle contractions, but they also have differences:   Muscle cramps are sporadic and painful. They may last a few seconds to a quarter of an hour. Muscle cramps are often more forceful and last longer than muscle spasms.  Muscle spasms may or may not be painful. They may also last just a few seconds or much longer. CAUSES  It is uncommon for cramps or spasms to be due to a serious underlying problem. In many cases, the cause of cramps or spasms is unknown. Some common causes are:   Overexertion.   Overuse from repetitive motions (doing the same thing over and over).   Remaining in a certain position for a long period of time.   Improper preparation, form, or technique while performing a sport or activity.   Dehydration.   Injury.   Side effects of some medicines.   Abnormally low levels of the salts and ions in your blood (electrolytes), especially potassium and calcium. This could happen if you are taking water pills (diuretics) or you are pregnant.  Some underlying medical problems can make it more likely to develop cramps or spasms. These include, but are not limited to:   Diabetes.   Parkinson disease.   Hormone disorders, such as thyroid problems.   Alcohol abuse.   Diseases specific to muscles, joints,  and bones.   Blood vessel disease where not enough blood is getting to the muscles.  HOME CARE INSTRUCTIONS   Stay well hydrated. Drink enough water and fluids to keep your urine clear or pale yellow.  It may be helpful to massage, stretch, and relax the affected muscle.  For tight or tense muscles, use a warm towel, heating pad, or hot shower water directed to the affected area.  If you are sore or have pain after a cramp or spasm, applying ice to the affected area may relieve discomfort.  Put ice in a plastic  bag.  Place a towel between your skin and the bag.  Leave the ice on for 15-20 minutes, 03-04 times a day.  Medicines used to treat a known cause of cramps or spasms may help reduce their frequency or severity. Only take over-the-counter or prescription medicines as directed by your caregiver. SEEK MEDICAL CARE IF:  Your cramps or spasms get more severe, more frequent, or do not improve over time.  MAKE SURE YOU:   Understand these instructions.  Will watch your condition.  Will get help right away if you are not doing well or get worse.   This information is not intended to replace advice given to you by your health care provider. Make sure you discuss any questions you have with your health care provider.   Document Released: 10/14/2001 Document Revised: 08/19/2012 Document Reviewed: 04/10/2012 Elsevier Interactive Patient Education [employer]

## 2015-09-26 NOTE — ED Provider Notes (Signed)
CSN: [id number]     Arrival date & time 09/25/15  2318 History   First MD Initiated Contact with Patient 09/26/15 0457     Chief Complaint  Patient presents with  . Back Pain     (Consider location/radiation/quality/duration/timing/severity/associated sxs/prior Treatment) HPI Comments: Patient presents to the emergency department for evaluation of neck and back pain. Patient reports that symptoms have been ongoing for almost a week. Patient has been seen in the ER and a urgent care for this. Patient reports severe, sharp and stabbing pains on the left side of her neck which radiates straight to her back. Pain is worsened with any minimal movement of her torso. She denies any direct injury. There is no shortness of breath. Patient is not noticing any numbness, tingling or weakness of the upper extremities.  Patient is a 60 y.o. female presenting with back pain.  Back Pain   History reviewed. No pertinent past medical history. Past Surgical History  Procedure Laterality Date  . Back surgery     Family History  Problem Relation Age of Onset  . Diabetes Mother   . Diabetes Brother    Social History  Substance Use Topics  . Smoking status: Former Games developer  . Smokeless tobacco: Never Used  . Alcohol Use: No   OB History    No data available     Review of Systems  Musculoskeletal: Positive for back pain.  All other systems reviewed and are negative.     Allergies  Review of patient's allergies indicates no known allergies.  Home Medications   Prior to Admission medications   Medication Sig Start Date End Date Taking? Authorizing Provider  methocarbamol (ROBAXIN) 500 MG tablet Take 1 tablet (500 mg total) by mouth 2 (two) times daily. 09/25/15   Garlon Hatchet, PA-C  naproxen (NAPROSYN) 500 MG tablet Take 1 tablet (500 mg total) by mouth 2 (two) times daily with a meal. 09/25/15   Garlon Hatchet, PA-C  oxyCODONE-acetaminophen (PERCOCET) 5-325 MG tablet Take 1 tablet by mouth  every 4 (four) hours as needed. 09/26/15   Gilda Crease, MD   BP 129/83 mmHg  Pulse 56  Temp(Src) 98 F (36.7 C) (Oral)  Resp 17  Ht  (1.702 m)  Wt 160 lb (72.576 kg)  BMI 25.05 kg/m2  SpO2 98% Physical Exam  Constitutional: She is oriented to person, place, and time. She appears well-developed and well-nourished. No distress.  HENT:  Head: Normocephalic and atraumatic.  Right Ear: Hearing normal.  Left Ear: Hearing normal.  Nose: Nose normal.  Mouth/Throat: Oropharynx is clear and moist and mucous membranes are normal.  Eyes: Conjunctivae and EOM are normal. Pupils are equal, round, and reactive to light.  Neck: Normal range of motion. Neck supple. Muscular tenderness present.    Cardiovascular: Regular rhythm, S1 normal and S2 normal.  Exam reveals no gallop and no friction rub.   No murmur heard. Pulmonary/Chest: Effort normal and breath sounds normal. No respiratory distress. She exhibits no tenderness.  Abdominal: Soft. Normal appearance and bowel sounds are normal. There is no hepatosplenomegaly. There is no tenderness. There is no rebound, no guarding, no tenderness at McBurney's point and negative Murphy's sign. No hernia.  Musculoskeletal: Normal range of motion.       Thoracic back: She exhibits tenderness, pain and spasm.       Back:  Neurological: She is alert and oriented to person, place, and time. She has normal strength. No cranial nerve deficit or  sensory deficit. Coordination normal. GCS eye subscore is 4. GCS verbal subscore is 5. GCS motor subscore is 6.  Skin: Skin is warm, dry and intact. No rash noted. No cyanosis.  Psychiatric: She has a normal mood and affect. Her speech is normal and behavior is normal. Thought content normal.  Nursing note and vitals reviewed.   ED Course  Procedures (including critical care time) Labs Review Labs Reviewed  BASIC METABOLIC PANEL - Abnormal; Notable for the following:    Glucose, Bld 105 (*)    All  other components within normal limits  CBC - Abnormal; Notable for the following:    Hemoglobin 11.9 (*)    MCH 25.8 (*)    All other components within normal limits  I-STAT TROPOININ, ED    Imaging Review Dg Chest 2 View  09/26/2015  CLINICAL DATA:  Acute onset of left-sided chest pain. Left arm and neck pain. Initial encounter. EXAM: CHEST  2 VIEW COMPARISON:  Chest radiograph performed 05/11/2008 FINDINGS: The lungs are well-aerated and clear. There is no evidence of focal opacification, pleural effusion or pneumothorax. The heart is normal in size; the mediastinal contour is within normal limits. No acute osseous abnormalities are seen. IMPRESSION: No acute cardiopulmonary process seen. Electronically Signed   By: Roanna RaiderJeffery  Chang M.D.   On: 09/26/2015 00:57   I have personally reviewed and evaluated these images and lab results as part of my medical decision-making.   EKG Interpretation   Date/Time:  Sunday Sep 26 2015 00:19:34 EDT Ventricular Rate:  81 PR Interval:  172 QRS Duration: 84 QT Interval:  378 QTC Calculation: 439 R Axis:   15 Text Interpretation:  Normal sinus rhythm Normal ECG Confirmed by Bladimir Auman   MD, Dearl Rudden [phone]) on 09/26/2015 4:58:18 AM Also confirmed by Blinda LeatherwoodPOLLINA   MD, Brandonn Capelli [phone]), editor Whitney PostLOGAN, Cala BradfordKIMBERLY [phone])  on 09/26/2015  10:00:11 AM      MDM   Final diagnoses:  Cervical radiculopathy  Muscle spasm of back    Patient presents to the emergency para for evaluation of pain on the left side of her back and neck area. There has not been any injury. Examination reveals severe spasm of the area. Patient has severe worsening of pain with any movement. She is feeling much better after Valium and analgesia. Will be discharged with symptomatic treatment.    Gilda Creasehristopher J Bexley Mclester, MD 09/27/15 [phone]

## 2016-06-30 ENCOUNTER — Other Ambulatory Visit: Payer: Self-pay | Admitting: Family Medicine

## 2016-06-30 DIAGNOSIS — Z1231 Encounter for screening mammogram for malignant neoplasm of breast: Secondary | ICD-10-CM

## 2016-07-03 ENCOUNTER — Ambulatory Visit
Admission: RE | Admit: 2016-07-03 | Discharge: 2016-07-03 | Disposition: A | Discharge status: Home / Self Care | Payer: BLUE CROSS/BLUE SHIELD | Source: Ambulatory Visit | Attending: Family Medicine | Admitting: Family Medicine

## 2016-07-03 DIAGNOSIS — Z1231 Encounter for screening mammogram for malignant neoplasm of breast: Secondary | ICD-10-CM

## 2017-05-16 ENCOUNTER — Other Ambulatory Visit (HOSPITAL_COMMUNITY)
Admission: RE | Admit: 2017-05-16 | Discharge: 2017-05-16 | Disposition: A | Discharge status: Home / Self Care | Payer: Commercial Managed Care - PPO | Source: Ambulatory Visit | Attending: Family Medicine | Admitting: Family Medicine

## 2017-05-16 ENCOUNTER — Other Ambulatory Visit: Payer: Self-pay | Admitting: Family Medicine

## 2017-05-16 DIAGNOSIS — Z0000 Encounter for general adult medical examination without abnormal findings: Secondary | ICD-10-CM | POA: Insufficient documentation

## 2017-05-17 LAB — CYTOLOGY - PAP
DIAGNOSIS: NEGATIVE
HPV (WINDOPATH): NOT DETECTED

## 2017-07-02 DIAGNOSIS — E7800 Pure hypercholesterolemia, unspecified: Secondary | ICD-10-CM | POA: Diagnosis not present

## 2017-10-31 DIAGNOSIS — H5213 Myopia, bilateral: Secondary | ICD-10-CM | POA: Diagnosis not present

## 2018-05-24 DIAGNOSIS — Z0000 Encounter for general adult medical examination without abnormal findings: Secondary | ICD-10-CM | POA: Diagnosis not present

## 2018-05-24 DIAGNOSIS — Z23 Encounter for immunization: Secondary | ICD-10-CM | POA: Diagnosis not present

## 2018-05-24 DIAGNOSIS — R7301 Impaired fasting glucose: Secondary | ICD-10-CM | POA: Diagnosis not present

## 2018-05-24 DIAGNOSIS — E7800 Pure hypercholesterolemia, unspecified: Secondary | ICD-10-CM | POA: Diagnosis not present

## 2018-05-24 DIAGNOSIS — M7711 Lateral epicondylitis, right elbow: Secondary | ICD-10-CM | POA: Diagnosis not present

## 2018-05-27 ENCOUNTER — Other Ambulatory Visit: Payer: Self-pay | Admitting: Family Medicine

## 2018-05-27 DIAGNOSIS — E2839 Other primary ovarian failure: Secondary | ICD-10-CM

## 2018-05-28 ENCOUNTER — Other Ambulatory Visit: Payer: Self-pay | Admitting: Family Medicine

## 2018-05-28 DIAGNOSIS — Z1231 Encounter for screening mammogram for malignant neoplasm of breast: Secondary | ICD-10-CM

## 2018-06-08 DIAGNOSIS — M7711 Lateral epicondylitis, right elbow: Secondary | ICD-10-CM | POA: Diagnosis not present

## 2018-07-02 ENCOUNTER — Other Ambulatory Visit: Payer: Commercial Managed Care - PPO

## 2018-07-02 ENCOUNTER — Ambulatory Visit
Admission: RE | Admit: 2018-07-02 | Discharge: 2018-07-02 | Disposition: A | Discharge status: Home / Self Care | Payer: Commercial Managed Care - PPO | Source: Ambulatory Visit | Attending: Family Medicine | Admitting: Family Medicine

## 2018-07-02 DIAGNOSIS — Z1231 Encounter for screening mammogram for malignant neoplasm of breast: Secondary | ICD-10-CM | POA: Diagnosis not present

## 2018-07-05 ENCOUNTER — Ambulatory Visit
Admission: RE | Admit: 2018-07-05 | Discharge: 2018-07-05 | Disposition: A | Discharge status: Home / Self Care | Payer: Commercial Managed Care - PPO | Source: Ambulatory Visit | Attending: Family Medicine | Admitting: Family Medicine

## 2018-07-05 DIAGNOSIS — M8588 Other specified disorders of bone density and structure, other site: Secondary | ICD-10-CM | POA: Diagnosis not present

## 2018-07-05 DIAGNOSIS — Z780 Asymptomatic menopausal state: Secondary | ICD-10-CM | POA: Diagnosis not present

## 2018-07-05 DIAGNOSIS — E2839 Other primary ovarian failure: Secondary | ICD-10-CM

## 2019-09-15 ENCOUNTER — Other Ambulatory Visit: Payer: Self-pay | Admitting: Family Medicine

## 2019-09-15 DIAGNOSIS — Z1231 Encounter for screening mammogram for malignant neoplasm of breast: Secondary | ICD-10-CM

## 2019-10-03 ENCOUNTER — Other Ambulatory Visit: Payer: Self-pay

## 2019-10-03 ENCOUNTER — Ambulatory Visit
Admission: RE | Admit: 2019-10-03 | Discharge: 2019-10-03 | Disposition: A | Discharge status: Home / Self Care | Payer: Commercial Managed Care - PPO | Source: Ambulatory Visit | Attending: Family Medicine | Admitting: Family Medicine

## 2019-10-03 DIAGNOSIS — Z1231 Encounter for screening mammogram for malignant neoplasm of breast: Secondary | ICD-10-CM

## 2020-01-06 ENCOUNTER — Ambulatory Visit (INDEPENDENT_AMBULATORY_CARE_PROVIDER_SITE_OTHER): Payer: Commercial Managed Care - PPO | Admitting: Allergy

## 2020-01-06 ENCOUNTER — Other Ambulatory Visit: Payer: Self-pay

## 2020-01-06 ENCOUNTER — Encounter: Payer: Self-pay | Admitting: Allergy

## 2020-01-06 VITALS — BP 150/84 | HR 64 | Temp 97.2°F | Resp 16 | Ht 66.14 in | Wt 152.0 lb

## 2020-01-06 DIAGNOSIS — L509 Urticaria, unspecified: Secondary | ICD-10-CM

## 2020-01-06 MED ORDER — FAMOTIDINE 20 MG PO TABS: 20.0000 mg | ORAL_TABLET | Freq: Two times a day (BID) | ORAL | 5 refills | Status: DC

## 2020-01-06 MED ORDER — FEXOFENADINE HCL 180 MG PO TABS: 180.0000 mg | ORAL_TABLET | Freq: Every day | ORAL | 5 refills | Status: DC

## 2020-01-06 NOTE — Patient Instructions (Addendum)
Hives: . Start allegra 180mg  twice a day - if it makes your too tired let know.  . Start famotidine 20mg  twice a day. . Avoid the following potential triggers: alcohol, tight clothing, NSAIDs, hot showers.  . See below for proper skin care.  Get bloodwork:  We are ordering labs, so please allow 1-2 weeks for the results to come back. With the newly implemented Cures Act, the labs might be visible to you at the same time that they become visible to me. However, I will not address the results until all of the results are back, so please be patient.  In the meantime, continue recommendations in your patient instructions, including avoidance measures (if applicable), until you hear from me.  Follow up in 4 weeks or sooner if needed.   Skin care recommendations  Bath time: . Always use lukewarm water. AVOID very hot or cold water. [place] Keep bathing time to 5-10 minutes. . Do NOT use bubble bath. . Use a mild soap and use just enough to wash the dirty areas. . Do NOT scrub skin vigorously.  . After bathing, pat dry your skin with a towel. Do NOT rub or scrub the skin.  Moisturizers and prescriptions:  . ALWAYS apply moisturizers immediately after bathing (within 3 minutes). This helps to lock-in moisture. . Use the moisturizer several times a day over the whole body. summer moisturizers include: Aveeno, CeraVe, Cetaphil. Marland Kitchen winter moisturizers include: Aquaphor, Vaseline, Cerave, Cetaphil, Eucerin, Vanicream. . When using moisturizers along with medications, the moisturizer should be applied about one hour after applying the medication to prevent diluting effect of the medication or moisturize around where you applied the medications. When not using medications, the moisturizer can be continued twice daily as maintenance.  Laundry and clothing: . Avoid laundry products with added color or perfumes. . Use unscented hypo-allergenic laundry products such as Tide free, Cheer free &  gentle, and All free and clear.  . If the skin still seems dry or sensitive, you can try double-rinsing the clothes. . Avoid tight or scratchy clothing such as wool. . Do not use fabric softeners or dyer sheets.

## 2020-01-06 NOTE — Progress Notes (Signed)
New Patient Note  RE: PARI LOMBARD MRN: [id number] DOB: Nov 25, 1955 Date of Office Visit: 01/06/2020  Referring provider: Deatra James, MD Primary care provider: Deatra James, MD  Chief Complaint: Urticaria (after taking Lisinopril), Pruritis (after taking lisinopril), and Angioedema (after taking lisinopril)  History of Present Illness: I had the pleasure of seeing Brenda Douglas for initial evaluation at the [facility] on 01/06/2020. She is a 64 y.o. female, who is referred here by Deatra James, MD for the evaluation of hives. Up to date with COVID-19 vaccine: yes  Rash started about 2.5 months ago after 1 week of taking lisinopril. This can occur anywhere on her body. Describes them as pruritic, raised, erythematous. Individual rashes lasts about 1-2 hours. No ecchymosis upon resolution. Associated symptoms include: two episode of lip/face angioedema. Suspected triggers are unknown - maybe lisinopril. She stopped the medication 1 month ago but still having daily breakouts. Denies any fevers, chills, foods, personal care products or recent infections. She has tried the following therapies: cetrizine 10mg  daily with good benefit but it makes her feel tired. Systemic steroids: 1 injection and 2 oral prednisone courses. Currently on no medications.  Previous work up includes: none. Previous history of rash/hives: no. Patient is up to date with the following cancer screening tests: physical exam, pap smears, colonoscopy and mammogram.  Reviewed images on the phone which was consistent with urticarial lesions.   Assessment and Plan: Yanai is a 64 y.o. female with: Urticaria Breaking out in pruritic hives 1 week after starting lisinopril about 2.5 months ago.  Since then she has stopped lisinopril but still having daily outbreaks.  2 episodes of lip and facial angioedema.  Hives move around and usually lasts less than 2 hours.  Steroids did help in the past however Zyrtec  makes her drowsy.  No prior evaluation.  No prior history of hives.  Denies any changes in diet, personal care products or recent infections. . Discussed with patient that lisinopril less likely to be a trigger at this point as she has been off it for 1 month and still having daily hives.  However I still recommended that she avoid it due to the lip and facial angioedema episodes. We will get blood work to rule out any other etiologies. No indication for any environmental or food allergy skin testing at this time. . Start allegra 180mg  twice a day - if it makes you too tired let 68 know.  . Start famotidine 20mg  twice a day. . Avoid the following potential triggers: alcohol, tight clothing, NSAIDs, hot showers.  . See below for proper skin care.  . If above regimen does not control symptoms then will discuss starting omalizumab injections next.  Return in about 4 weeks (around 02/03/2020).  Meds ordered this encounter  Medications  . famotidine (PEPCID) 20 MG tablet    Sig: Take 1 tablet (20 mg total) by mouth 2 (two) times daily.    Dispense:  60 tablet    Refill:  5  . fexofenadine (ALLEGRA) 180 MG tablet    Sig: Take 1 tablet (180 mg total) by mouth daily.    Dispense:  60 tablet    Refill:  5    Lab Orders     Alpha-Gal Panel     ANA w/Reflex     CBC with Differential/Platelet     Chronic Urticaria     Comprehensive metabolic panel     C-reactive protein     Sedimentation  rate     Tryptase     Protein Electrophoresis, Urine Rflx.     Protein electrophoresis, serum     Thyroid Cascade Profile  Other allergy screening: Asthma: no Rhino conjunctivitis: no Food allergy: no  Patient consumes red meat on a daily basis. No recent tick bites.  Medication allergy: no Hymenoptera allergy: no Eczema:no History of recurrent infections suggestive of immunodeficency: no  Diagnostics: None.  Past Medical History: Patient Active Problem List   Diagnosis Date Noted  . Urticaria  01/06/2020   Past Medical History:  Diagnosis Date  . Angio-edema   . Urticaria    Past Surgical History: Past Surgical History:  Procedure Laterality Date  . BACK SURGERY     Medication List:  Current Outpatient Medications  Medication Sig Dispense Refill  . Multiple Vitamin (MULTIVITAMIN) tablet Take 1 tablet by mouth daily.    . simvastatin (ZOCOR) 20 MG tablet SMARTSIG:1 Tablet(s) By Mouth Every Evening    . famotidine (PEPCID) 20 MG tablet Take 1 tablet (20 mg total) by mouth 2 (two) times daily. 60 tablet 5  . fexofenadine (ALLEGRA) 180 MG tablet Take 1 tablet (180 mg total) by mouth daily. 60 tablet 5   No current facility-administered medications for this visit.   Allergies: No Known Allergies Social History: Social History   Socioeconomic History  . Marital status: Single    Spouse name: Not on file  . Number of children: Not on file  . Years of education: Not on file  . Highest education level: Not on file  Occupational History  . Not on file  Tobacco Use  . Smoking status: Former Games developer  . Smokeless tobacco: Never Used  Vaping Use  . Vaping Use: Never used  Substance and Sexual Activity  . Alcohol use: No  . Drug use: No  . Sexual activity: Not on file  Other Topics Concern  . Not on file  Social History Narrative  . Not on file   Social Determinants of Health   Financial Resource Strain:   . Difficulty of Paying Living Expenses: Not on file  Food Insecurity:   . Worried About Programme researcher, broadcasting/film/video in the Last Year: Not on file  . Ran Out of Food in the Last Year: Not on file  Transportation Needs:   . Lack of Transportation (Medical): Not on file  . Lack of Transportation (Non-Medical): Not on file  Physical Activity:   . Days of Exercise per Week: Not on file  . Minutes of Exercise per Session: Not on file  Stress:   . Feeling of Stress : Not on file  Social Connections:   . Frequency of Communication with Friends and Family: Not on file  .  Frequency of Social Gatherings with Friends and Family: Not on file  . Attends Religious Services: Not on file  . Active Member of Clubs or Organizations: Not on file  . Attends Banker Meetings: Not on file  . Marital Status: Not on file   Lives in a 62 year old house. Smoking: denies Occupation: Research scientist (life sciences) HistorySurveyor, minerals in the house: no Carpet in the family room: no Carpet in the bedroom: no Heating: electric Cooling: window Pet: no  Family History: Family History  Problem Relation Age of Onset  . Diabetes Mother   . Diabetes Brother   . Asthma Neg Hx   . Eczema Neg Hx   . Urticaria Neg Hx   . Allergic rhinitis  Neg Hx    Review of Systems  Constitutional: Negative for appetite change, chills, fever and unexpected weight change.  HENT: Negative for congestion and rhinorrhea.   Eyes: Negative for itching.  Respiratory: Negative for cough, chest tightness, shortness of breath and wheezing.   Cardiovascular: Negative for chest pain.  Gastrointestinal: Negative for abdominal pain.  Genitourinary: Negative for difficulty urinating.  Skin: Positive for rash.  Neurological: Negative for headaches.   Objective: BP (!) 150/84 (BP Location: Right Arm, Patient Position: Sitting, Cuff Size: Normal)   Pulse 64   Temp (!) 97.2 F (36.2 C)   Resp 16   Ht 5' 6.14" (1.68 m)   Wt 152 lb (68.9 kg)   SpO2 99%   BMI 24.43 kg/m  Body mass index is 24.43 kg/m. Physical Exam Vitals and nursing note reviewed.  Constitutional:      Appearance: Normal appearance. She is well-developed.  HENT:     Head: Normocephalic and atraumatic.     Right Ear: External ear normal.     Left Ear: External ear normal.     Nose: Nose normal.     Mouth/Throat:     Mouth: Mucous membranes are moist.     Pharynx: Oropharynx is clear.  Eyes:     Conjunctiva/sclera: Conjunctivae normal.  Cardiovascular:     Rate and Rhythm: Normal rate and regular  rhythm.     Heart sounds: Normal heart sounds. No murmur heard.  No friction rub. No gallop.   Pulmonary:     Effort: Pulmonary effort is normal.     Breath sounds: Normal breath sounds. No wheezing, rhonchi or rales.  Abdominal:     Palpations: Abdomen is soft.  Musculoskeletal:     Cervical back: Neck supple.  Skin:    General: Skin is warm.     Findings: No rash.  Neurological:     Mental Status: She is alert and oriented to person, place, and time.  Psychiatric:        Behavior: Behavior normal.    The plan was reviewed with the patient/family, and all questions/concerned were addressed.  It was my pleasure to see Brenda Douglas today and participate in her care. Please feel free to contact me with any questions or concerns.  Sincerely,  Wyline Mood, DO Allergy & Immunology  [facility] office: [phone] [facility] office: [phone] [place] office: [phone]

## 2020-01-06 NOTE — Assessment & Plan Note (Addendum)
Breaking out in pruritic hives 1 week after starting lisinopril about 2.5 months ago.  Since then she has stopped lisinopril but still having daily outbreaks.  2 episodes of lip and facial angioedema.  Hives move around and usually lasts less than 2 hours.  Steroids did help in the past however Zyrtec makes her drowsy.  No prior evaluation.  No prior history of hives.  Denies any changes in diet, personal care products or recent infections. . Discussed with patient that lisinopril less likely to be a trigger at this point as she has been off it for 1 month and still having daily hives.  However I still recommended that she avoid it due to the lip and facial angioedema episodes. We will get blood work to rule out any other etiologies. No indication for any environmental or food allergy skin testing at this time. . Start allegra 180mg  twice a day - if it makes you too tired let know.  . Start famotidine 20mg  twice a day. . Avoid the following potential triggers: alcohol, tight clothing, NSAIDs, hot showers.  . See below for proper skin care.  . If above regimen does not control symptoms then will discuss starting omalizumab injections next.

## 2020-01-14 LAB — CBC WITH DIFFERENTIAL/PLATELET
Basophils Absolute: 0 10*3/uL (ref 0.0–0.2)
Basos: 0 %
EOS (ABSOLUTE): 0.2 10*3/uL (ref 0.0–0.4)
Eos: 3 %
Hematocrit: 38.3 % (ref 34.0–46.6)
Hemoglobin: 12.8 g/dL (ref 11.1–15.9)
Immature Grans (Abs): 0 10*3/uL (ref 0.0–0.1)
Immature Granulocytes: 0 %
Lymphocytes Absolute: 2.6 10*3/uL (ref 0.7–3.1)
Lymphs: 33 %
MCH: 27.1 pg (ref 26.6–33.0)
MCHC: 33.4 g/dL (ref 31.5–35.7)
MCV: 81 fL (ref 79–97)
Monocytes Absolute: 0.5 10*3/uL (ref 0.1–0.9)
Monocytes: 7 %
Neutrophils Absolute: 4.4 10*3/uL (ref 1.4–7.0)
Neutrophils: 57 %
Platelets: 352 10*3/uL (ref 150–450)
RBC: 4.72 x10E6/uL (ref 3.77–5.28)
RDW: 12.8 % (ref 11.7–15.4)
WBC: 7.8 10*3/uL (ref 3.4–10.8)

## 2020-01-14 LAB — COMPREHENSIVE METABOLIC PANEL
ALT: 18 IU/L (ref 0–32)
AST: 15 IU/L (ref 0–40)
Albumin/Globulin Ratio: 1.6 (ref 1.2–2.2)
Albumin: 4.6 g/dL (ref 3.8–4.8)
Alkaline Phosphatase: 69 IU/L (ref 48–121)
BUN/Creatinine Ratio: 17 (ref 12–28)
BUN: 12 mg/dL (ref 8–27)
Bilirubin Total: 0.3 mg/dL (ref 0.0–1.2)
CO2: 27 mmol/L (ref 20–29)
Calcium: 9.8 mg/dL (ref 8.7–10.3)
Chloride: 103 mmol/L (ref 96–106)
Creatinine, Ser: 0.69 mg/dL (ref 0.57–1.00)
GFR calc Af Amer: 108 mL/min/{1.73_m2} (ref >59)
GFR calc non Af Amer: 94 mL/min/{1.73_m2} (ref >59)
Globulin, Total: 2.9 g/dL (ref 1.5–4.5)
Glucose: 94 mg/dL (ref 65–99)
Potassium: 4 mmol/L (ref 3.5–5.2)
Sodium: 142 mmol/L (ref 134–144)
Total Protein: 7.5 g/dL (ref 6.0–8.5)

## 2020-01-14 LAB — ANA W/REFLEX: Anti Nuclear Antibody (ANA): NEGATIVE

## 2020-01-14 LAB — C-REACTIVE PROTEIN: CRP: 4 mg/L (ref 0–10)

## 2020-01-14 LAB — ALPHA-GAL PANEL
Alpha Gal IgE*: <0.1 kU/L (ref <0.10)
Beef (Bos spp) IgE: <0.1 kU/L (ref <0.35)
Class Interpretation: 0
Class Interpretation: 0
Class Interpretation: 0
Lamb/Mutton (Ovis spp) IgE: <0.1 kU/L (ref <0.35)
Pork (Sus spp) IgE: <0.1 kU/L (ref <0.35)

## 2020-01-14 LAB — SEDIMENTATION RATE: Sed Rate: 37 mm/hr (ref 0–40)

## 2020-01-14 LAB — TRYPTASE: Tryptase: 7.7 ug/L (ref 2.2–13.2)

## 2020-01-14 LAB — PROTEIN ELECTROPHORESIS, URINE REFLEX
Albumin ELP, Urine: 41.2 %
Alpha-1-Globulin, U: 5 %
Alpha-2-Globulin, U: 11.7 %
Beta Globulin, U: 17.3 %
Gamma Globulin, U: 24.7 %
Protein, Ur: 19.1 mg/dL

## 2020-01-14 LAB — PROTEIN ELECTROPHORESIS, SERUM
A/G Ratio: 1.3 (ref 0.7–1.7)
Albumin ELP: 4.3 g/dL (ref 2.9–4.4)
Alpha 1: 0.2 g/dL (ref 0.0–0.4)
Alpha 2: 0.7 g/dL (ref 0.4–1.0)
Beta: 1.3 g/dL (ref 0.7–1.3)
Gamma Globulin: 1 g/dL (ref 0.4–1.8)
Globulin, Total: 3.2 g/dL (ref 2.2–3.9)

## 2020-01-14 LAB — CHRONIC URTICARIA: cu index: 39.4 — ABNORMAL HIGH (ref <10)

## 2020-01-14 LAB — THYROID CASCADE PROFILE: TSH: 2.48 u[IU]/mL (ref 0.450–4.500)

## 2020-02-02 ENCOUNTER — Ambulatory Visit (INDEPENDENT_AMBULATORY_CARE_PROVIDER_SITE_OTHER): Payer: Commercial Managed Care - PPO | Admitting: Allergy

## 2020-02-02 ENCOUNTER — Encounter: Payer: Self-pay | Admitting: Allergy

## 2020-02-02 ENCOUNTER — Other Ambulatory Visit: Payer: Self-pay

## 2020-02-02 VITALS — BP 142/80 | HR 57 | Resp 18 | Ht 67.5 in | Wt 159.0 lb

## 2020-02-02 DIAGNOSIS — L509 Urticaria, unspecified: Secondary | ICD-10-CM | POA: Diagnosis not present

## 2020-02-02 DIAGNOSIS — I10 Essential (primary) hypertension: Secondary | ICD-10-CM | POA: Diagnosis not present

## 2020-02-02 NOTE — Patient Instructions (Addendum)
Take your blood pressure medication but make sure it's NOT an ACE-INHIBITOR - check with your pharmacist.   Hives: . Continue allegra (fexofenadine) 180mg  once a day  o If hives not controlled then take twice a day. . No need to take famotidine as your hives have been well controlled with fexofenadine only. . Avoid the following potential triggers: alcohol, tight clothing, NSAIDs, hot showers.  . See below for proper skin care.   Follow up in 3 months or sooner if needed.  Follow up with your PCP regarding your blood pressure.   Skin care recommendations  Bath time: . Always use lukewarm water. AVOID very hot or cold water. Keep bathing time to 5-10 minutes. . Do NOT use bubble bath. . Use a mild soap and use just enough to wash the dirty areas. . Do NOT scrub skin vigorously.  . After bathing, pat dry your skin with a towel. Do NOT rub or scrub the skin.  Moisturizers and prescriptions:  . ALWAYS apply moisturizers immediately after bathing (within 3 minutes). This helps to lock-in moisture. . Use the moisturizer several times a day over the whole body. Marland Kitchen summer moisturizers include: Aveeno, CeraVe, Cetaphil. Peri Jefferson winter moisturizers include: Aquaphor, Vaseline, Cerave, Cetaphil, Eucerin, Vanicream. . When using moisturizers along with medications, the moisturizer should be applied about one hour after applying the medication to prevent diluting effect of the medication or moisturize around where you applied the medications. When not using medications, the moisturizer can be continued twice daily as maintenance.  Laundry and clothing: . Avoid laundry products with added color or perfumes. . Use unscented hypo-allergenic laundry products such as Tide free, Cheer free & gentle, and All free and clear.  . If the skin still seems dry or sensitive, you can try double-rinsing the clothes. . Avoid tight or scratchy clothing such as wool. . Do not use fabric softeners or dyer  sheets.

## 2020-02-02 NOTE — Progress Notes (Signed)
Follow Up Note  RE: Brenda Douglas MRN: [id number] DOB: March 13, 1956 Date of Office Visit: 02/02/2020  Referring provider: Donald Prose, MD Primary care provider: Donald Prose, MD  Chief Complaint: Urticaria  History of Present Illness: I had the pleasure of seeing Brenda Douglas for a follow up visit at the [facility] on 02/02/2020. She is a 64 y.o. female, who is being followed for urticaria. Her previous allergy office visit was on 01/06/2020 with Dr. Maudie Mercury. Today is a regular follow up visit.  Urticaria Currently on allegra 116m once a day only. Broke out in hives once after she was late taking her daily allegra pill.   Did not take famotidine yet.  She also did not take her blood pressure medications either.   Assessment and Plan: DSkarlettis a 64y.o. female with: Urticaria Past history - Breaking out in pruritic hives 1 week after starting lisinopril about 2.5 months ago.  Since then she has stopped lisinopril but still having daily outbreaks.  2 episodes of lip and facial angioedema.  Hives move around and usually lasts less than 2 hours.  Steroids did help in the past however Zyrtec makes her drowsy.  No prior evaluation.  No prior history of hives.  Denies any changes in diet, personal care products or recent infections. Interim history - CBC diff, CMP, ANA, TSH, Esr, CRP, tryptase, alpha gal, SPEP, UPEP all normal. Elevated CU index. Only taking Allegra 1841monce a day with good control. . Continue allegra (fexofenadine) 18061mnce a day  o If hives not controlled then take twice a day. . No need to take famotidine as your hives have been well controlled with fexofenadine only. . Avoid the following potential triggers: alcohol, tight clothing, NSAIDs, hot showers.  . See below for proper skin care.   Hypertension Advised patient to start her antihypertensive medication but to avoid ace-inhibitors given history of angioedema.  Follow up with PCP  regarding blood pressure.   Return in about 3 months (around 05/03/2020).  Diagnostics: None.  Medication List:  Current Outpatient Medications  Medication Sig Dispense Refill  . amLODipine (NORVASC) 2.5 MG tablet Take 2.5 mg by mouth daily.    . fexofenadine (ALLEGRA) 180 MG tablet Take 1 tablet (180 mg total) by mouth daily. 60 tablet 5  . Multiple Vitamin (MULTIVITAMIN) tablet Take 1 tablet by mouth daily.    . simvastatin (ZOCOR) 20 MG tablet SMARTSIG:1 Tablet(s) By Mouth Every Evening     No current facility-administered medications for this visit.   Allergies: Allergies  Allergen Reactions  . Lisinopril     Potential angioedema   I reviewed her past medical history, social history, family history, and environmental history and no significant changes have been reported from her previous visit.  Review of Systems  Constitutional: Negative for appetite change, chills, fever and unexpected weight change.  HENT: Negative for congestion and rhinorrhea.   Eyes: Negative for itching.  Respiratory: Negative for cough, chest tightness, shortness of breath and wheezing.   Cardiovascular: Negative for chest pain.  Gastrointestinal: Negative for abdominal pain.  Genitourinary: Negative for difficulty urinating.  Skin: Positive for rash.  Neurological: Negative for headaches.   Objective: BP (!) 142/80   Pulse (!) 57   Resp 18   Ht 5' 7.5" (1.715 m)   Wt 159 lb (72.1 kg)   SpO2 97%   BMI 24.54 kg/m  Body mass index is 24.54 kg/m. Physical Exam Vitals and nursing note  reviewed.  Constitutional:      Appearance: Normal appearance. She is well-developed.  HENT:     Head: Normocephalic and atraumatic.     Right Ear: External ear normal.     Left Ear: External ear normal.     Nose: Nose normal.     Mouth/Throat:     Mouth: Mucous membranes are moist.     Pharynx: Oropharynx is clear.  Eyes:     Conjunctiva/sclera: Conjunctivae normal.  Cardiovascular:     Rate and  Rhythm: Normal rate and regular rhythm.     Heart sounds: Normal heart sounds. No murmur heard.  No friction rub. No gallop.   Pulmonary:     Effort: Pulmonary effort is normal.     Breath sounds: Normal breath sounds. No wheezing, rhonchi or rales.  Musculoskeletal:     Cervical back: Neck supple.  Skin:    General: Skin is warm.     Findings: No rash.  Neurological:     Mental Status: She is alert and oriented to person, place, and time.  Psychiatric:        Behavior: Behavior normal.    Previous notes and tests were reviewed. The plan was reviewed with the patient/family, and all questions/concerned were addressed.  It was my pleasure to see Brenda Douglas today and participate in her care. Please feel free to contact me with any questions or concerns.  Sincerely,  Rexene Alberts, DO Allergy & Immunology  [facility] office: [place] office: [phone]

## 2020-02-02 NOTE — Assessment & Plan Note (Signed)
Past history - Breaking out in pruritic hives 1 week after starting lisinopril about 2.5 months ago.  Since then she has stopped lisinopril but still having daily outbreaks.  2 episodes of lip and facial angioedema.  Hives move around and usually lasts less than 2 hours.  Steroids did help in the past however Zyrtec makes her drowsy.  No prior evaluation.  No prior history of hives.  Denies any changes in diet, personal care products or recent infections. Interim history - CBC diff, CMP, ANA, TSH, Esr, CRP, tryptase, alpha gal, SPEP, UPEP all normal. Elevated CU index. Only taking Allegra 125m once a day with good control. . Continue allegra (fexofenadine) 1860monce a day  o If hives not controlled then take twice a day. . No need to take famotidine as your hives have been well controlled with fexofenadine only. . Avoid the following potential triggers: alcohol, tight clothing, NSAIDs, hot showers.  . See below for proper skin care.

## 2020-02-02 NOTE — Assessment & Plan Note (Addendum)
Advised patient to start her antihypertensive medication but to avoid ace-inhibitors given history of angioedema.  Follow up with PCP regarding blood pressure.

## 2020-05-17 ENCOUNTER — Other Ambulatory Visit: Payer: Commercial Managed Care - PPO

## 2020-05-17 DIAGNOSIS — Z20822 Contact with and (suspected) exposure to covid-19: Secondary | ICD-10-CM

## 2020-05-20 LAB — NOVEL CORONAVIRUS, NAA: SARS-CoV-2, NAA: NOT DETECTED

## 2020-05-20 LAB — SARS-COV-2, NAA 2 DAY TAT

## 2020-06-09 IMAGING — MG DIGITAL SCREENING BILATERAL MAMMOGRAM WITH TOMO AND CAD
8 series · 8 of 24 positions shown · non-contrast
Comparison: Previous exam(s).

CLINICAL DATA: Screening.

EXAM:
DIGITAL SCREENING BILATERAL MAMMOGRAM WITH TOMO AND CAD

[L MLO synth-2D]
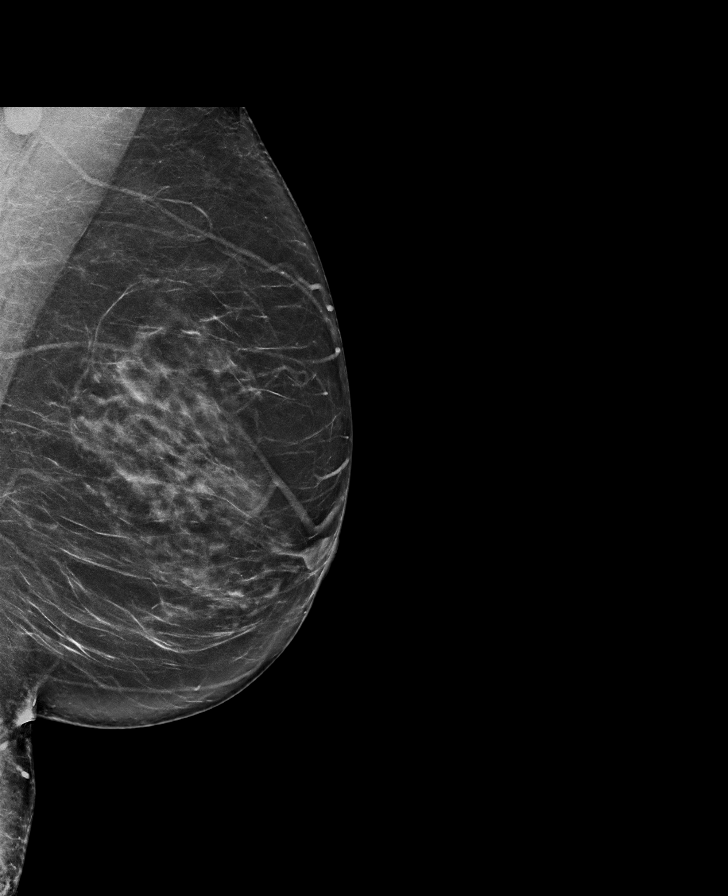

[R CC synth-2D]
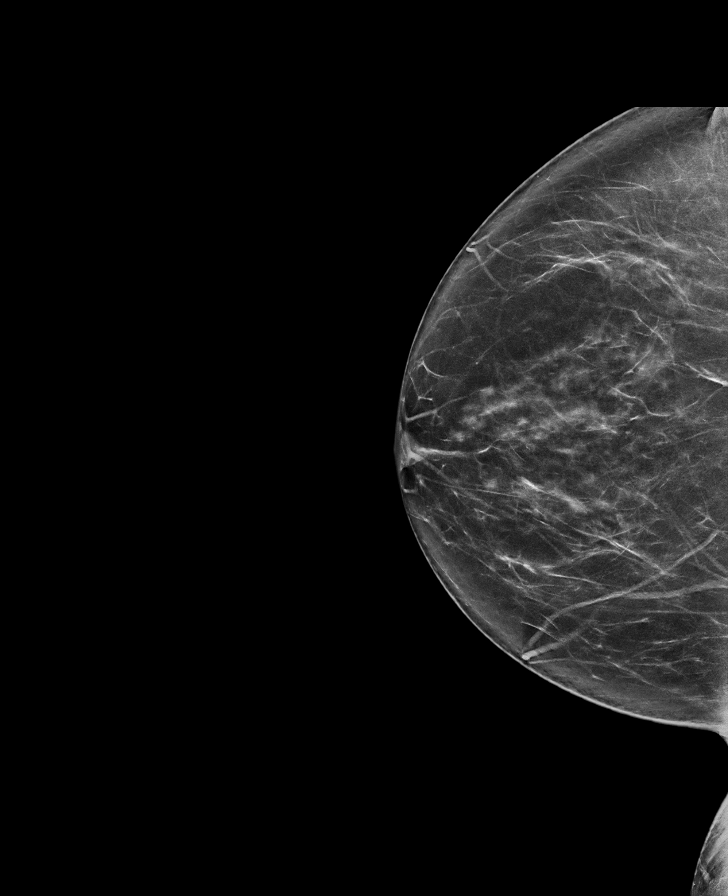

[R MLO synth-2D]
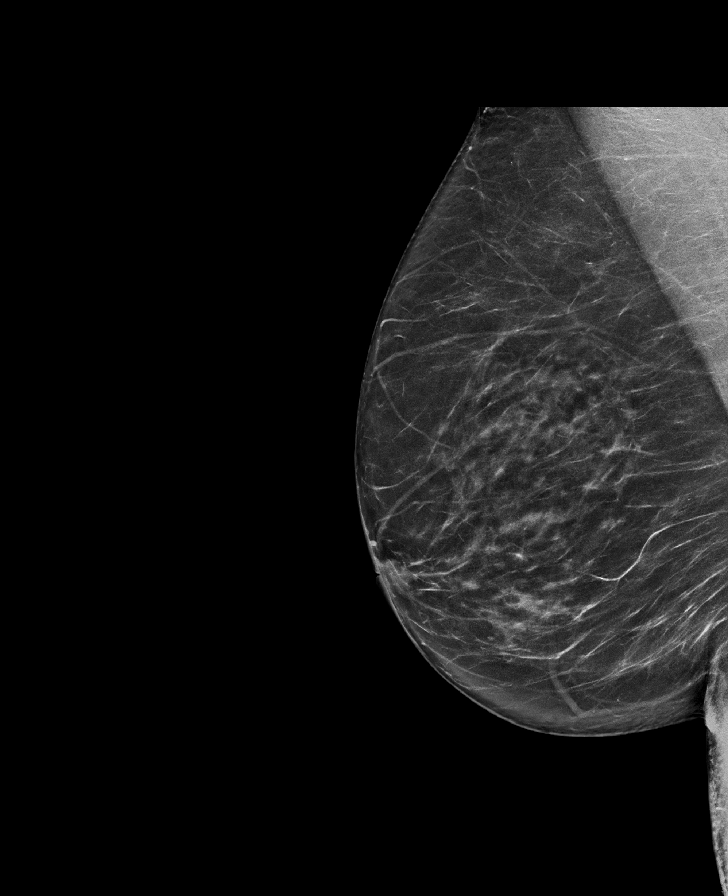

[L CC synth-2D]
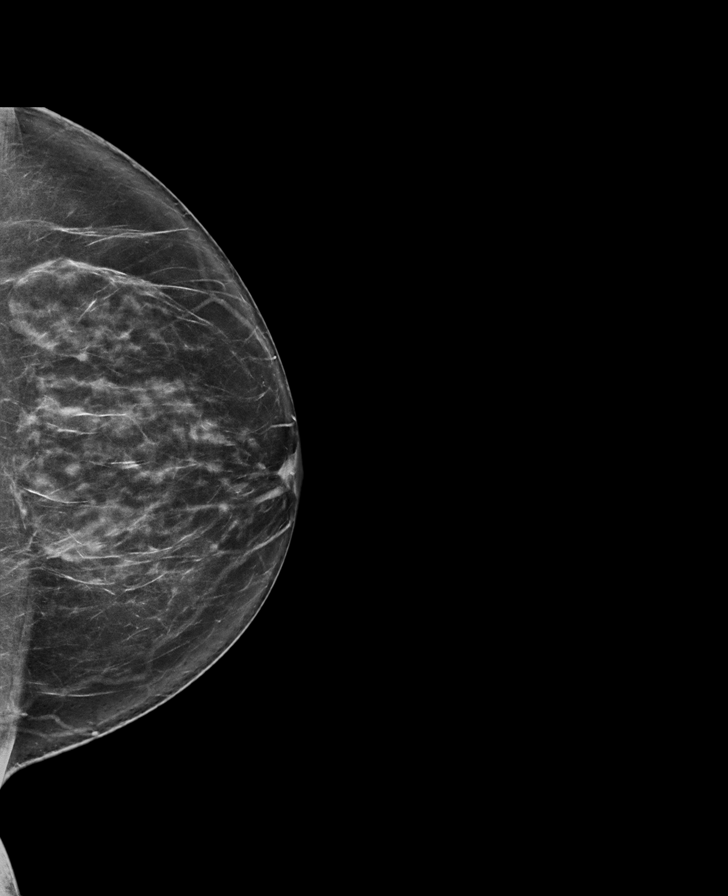

[L MLO tomo · tomo slice 40/79.0]
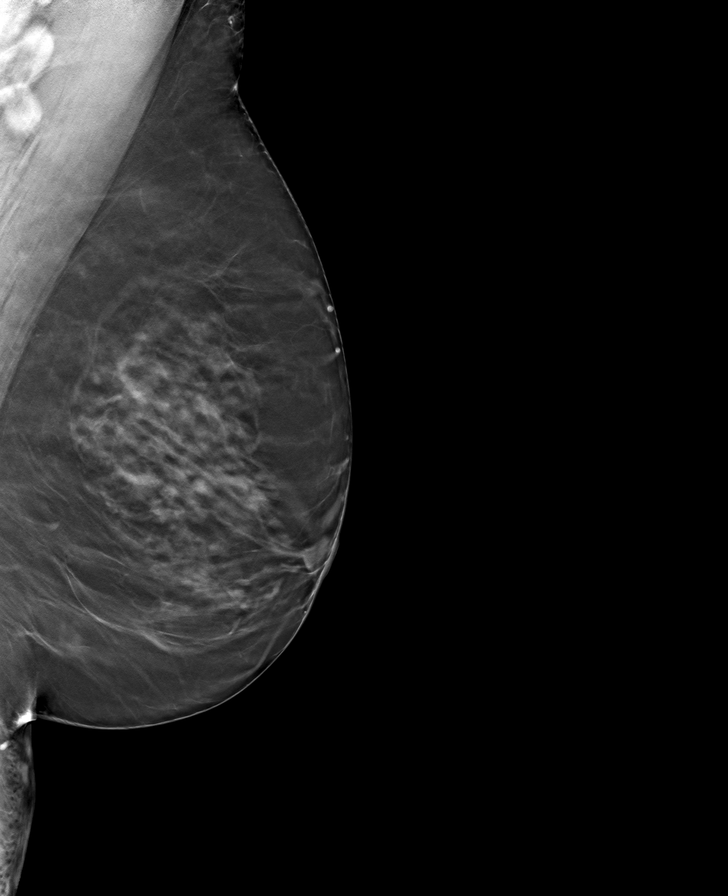

[L CC tomo · tomo slice 41/80.0]
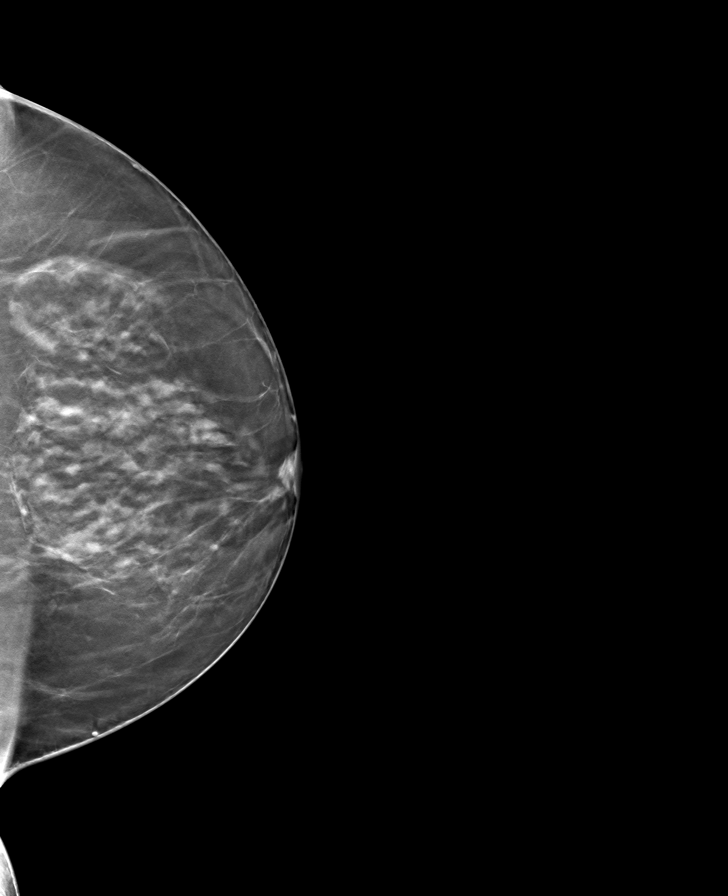

[R CC tomo · tomo slice 40/79.0]
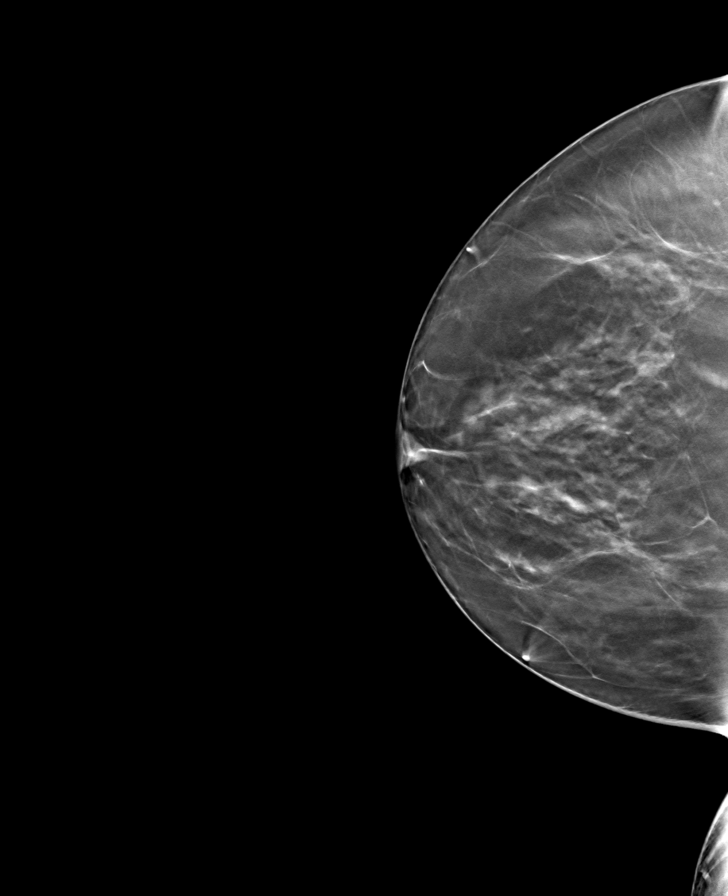

[R MLO tomo · tomo slice 41/80.0]
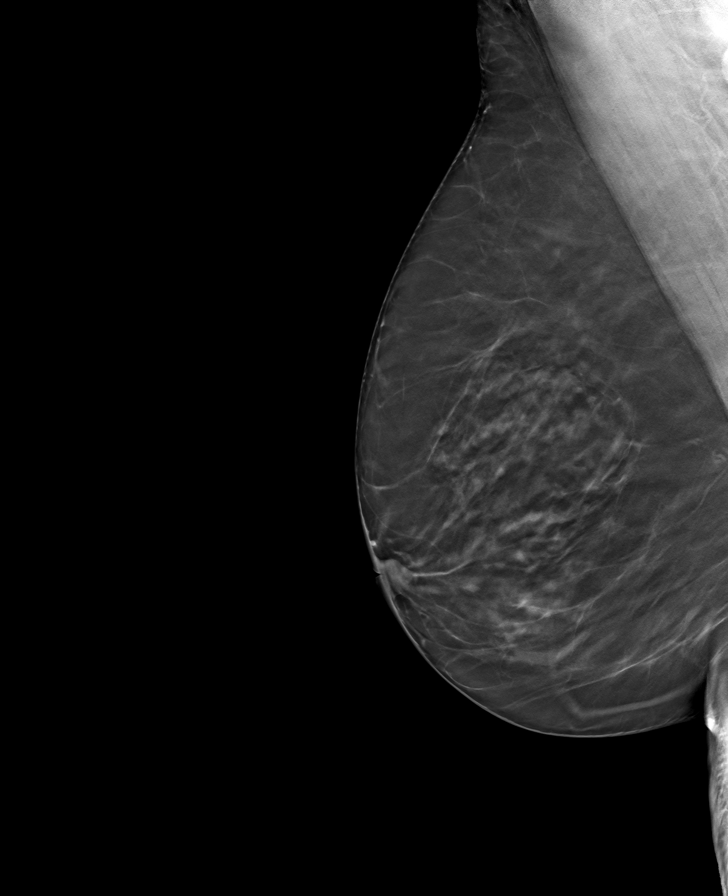

[8 of 24 positions shown; findings below may reference images not displayed]

ACR Breast Density Category c: The breast tissue is heterogeneously
dense, which may obscure small masses.
FINDINGS: There are no findings suspicious for malignancy. Images were
processed with CAD.
IMPRESSION: No mammographic evidence of malignancy. A result letter of this
screening mammogram will be mailed directly to the patient.

RECOMMENDATION:
Screening mammogram in one year. (Code:FT-U-LHB)

BI-RADS CATEGORY  1: Negative.

## 2020-06-16 ENCOUNTER — Other Ambulatory Visit: Payer: Self-pay | Admitting: Family Medicine

## 2020-06-16 DIAGNOSIS — M8580 Other specified disorders of bone density and structure, unspecified site: Secondary | ICD-10-CM

## 2020-06-21 ENCOUNTER — Other Ambulatory Visit: Payer: Self-pay | Admitting: Family Medicine

## 2020-06-21 DIAGNOSIS — Z1231 Encounter for screening mammogram for malignant neoplasm of breast: Secondary | ICD-10-CM

## 2020-11-18 ENCOUNTER — Ambulatory Visit
Admission: RE | Admit: 2020-11-18 | Discharge: 2020-11-18 | Disposition: A | Discharge status: Home / Self Care | Payer: Commercial Managed Care - PPO | Source: Ambulatory Visit | Attending: Family Medicine | Admitting: Family Medicine

## 2020-11-18 ENCOUNTER — Other Ambulatory Visit: Payer: Self-pay

## 2020-11-18 DIAGNOSIS — M8580 Other specified disorders of bone density and structure, unspecified site: Secondary | ICD-10-CM

## 2020-11-18 DIAGNOSIS — Z1231 Encounter for screening mammogram for malignant neoplasm of breast: Secondary | ICD-10-CM

## 2021-05-13 ENCOUNTER — Other Ambulatory Visit: Payer: Self-pay | Admitting: Allergy

## 2021-07-15 ENCOUNTER — Other Ambulatory Visit: Payer: Self-pay | Admitting: Allergy

## 2022-03-06 ENCOUNTER — Emergency Department (HOSPITAL_COMMUNITY)
Admission: EM | Admit: 2022-03-06 | Discharge: 2022-03-07 | Disposition: A | Discharge status: Home / Self Care | Payer: Commercial Managed Care - PPO | Attending: Emergency Medicine | Admitting: Emergency Medicine

## 2022-03-06 ENCOUNTER — Other Ambulatory Visit: Payer: Self-pay

## 2022-03-06 ENCOUNTER — Encounter (HOSPITAL_COMMUNITY): Payer: Self-pay

## 2022-03-06 ENCOUNTER — Emergency Department (HOSPITAL_COMMUNITY): Payer: Commercial Managed Care - PPO

## 2022-03-06 DIAGNOSIS — I10 Essential (primary) hypertension: Secondary | ICD-10-CM | POA: Diagnosis not present

## 2022-03-06 DIAGNOSIS — B338 Other specified viral diseases: Secondary | ICD-10-CM

## 2022-03-06 DIAGNOSIS — Z79899 Other long term (current) drug therapy: Secondary | ICD-10-CM | POA: Diagnosis not present

## 2022-03-06 DIAGNOSIS — R197 Diarrhea, unspecified: Secondary | ICD-10-CM | POA: Insufficient documentation

## 2022-03-06 DIAGNOSIS — R0602 Shortness of breath: Secondary | ICD-10-CM | POA: Insufficient documentation

## 2022-03-06 DIAGNOSIS — D72829 Elevated white blood cell count, unspecified: Secondary | ICD-10-CM | POA: Diagnosis not present

## 2022-03-06 DIAGNOSIS — Z20822 Contact with and (suspected) exposure to covid-19: Secondary | ICD-10-CM | POA: Diagnosis not present

## 2022-03-06 DIAGNOSIS — B974 Respiratory syncytial virus as the cause of diseases classified elsewhere: Secondary | ICD-10-CM | POA: Diagnosis not present

## 2022-03-06 LAB — BASIC METABOLIC PANEL
Anion gap: 19 — ABNORMAL HIGH (ref 5–15)
BUN: 28 mg/dL — ABNORMAL HIGH (ref 8–23)
CO2: 21 mmol/L — ABNORMAL LOW (ref 22–32)
Calcium: 9.9 mg/dL (ref 8.9–10.3)
Chloride: 96 mmol/L — ABNORMAL LOW (ref 98–111)
Creatinine, Ser: 1.53 mg/dL — ABNORMAL HIGH (ref 0.44–1.00)
GFR, Estimated: 38 mL/min — ABNORMAL LOW (ref >60)
Glucose, Bld: 177 mg/dL — ABNORMAL HIGH (ref 70–99)
Potassium: 3.3 mmol/L — ABNORMAL LOW (ref 3.5–5.1)
Sodium: 136 mmol/L (ref 135–145)

## 2022-03-06 LAB — CBC WITH DIFFERENTIAL/PLATELET
Abs Immature Granulocytes: 0.1 10*3/uL — ABNORMAL HIGH (ref 0.00–0.07)
Basophils Absolute: 0.1 10*3/uL (ref 0.0–0.1)
Basophils Relative: 0 %
Eosinophils Absolute: 0 10*3/uL (ref 0.0–0.5)
Eosinophils Relative: 0 %
HCT: 44.5 % (ref 36.0–46.0)
Hemoglobin: 14.5 g/dL (ref 12.0–15.0)
Immature Granulocytes: 1 %
Lymphocytes Relative: 6 %
Lymphs Abs: 1 10*3/uL (ref 0.7–4.0)
MCH: 26.8 pg (ref 26.0–34.0)
MCHC: 32.6 g/dL (ref 30.0–36.0)
MCV: 82.1 fL (ref 80.0–100.0)
Monocytes Absolute: 1.6 10*3/uL — ABNORMAL HIGH (ref 0.1–1.0)
Monocytes Relative: 9 %
Neutro Abs: 14.5 10*3/uL — ABNORMAL HIGH (ref 1.7–7.7)
Neutrophils Relative %: 84 %
Platelets: 261 10*3/uL (ref 150–400)
RBC: 5.42 MIL/uL — ABNORMAL HIGH (ref 3.87–5.11)
RDW: 13.5 % (ref 11.5–15.5)
WBC: 17.3 10*3/uL — ABNORMAL HIGH (ref 4.0–10.5)
nRBC: 0 % (ref 0.0–0.2)

## 2022-03-06 LAB — SARS CORONAVIRUS 2 BY RT PCR: SARS Coronavirus 2 by RT PCR: NEGATIVE

## 2022-03-06 LAB — LACTIC ACID, PLASMA: Lactic Acid, Venous: 1.7 mmol/L (ref 0.5–1.9)

## 2022-03-06 MED ORDER — ACETAMINOPHEN 500 MG PO TABS
1000.0000 mg | ORAL_TABLET | Freq: Once | ORAL | Status: AC
  Administered 2022-03-06: 1000 mg via ORAL
  Filled 2022-03-06: qty 2

## 2022-03-06 MED ORDER — SODIUM CHLORIDE 0.9 % IV BOLUS
1000.0000 mL | Freq: Once | INTRAVENOUS | Status: AC
  Administered 2022-03-06: 1000 mL via INTRAVENOUS

## 2022-03-06 MED ORDER — ONDANSETRON HCL 4 MG/2ML IJ SOLN
4.0000 mg | Freq: Once | INTRAMUSCULAR | Status: AC
  Administered 2022-03-06: 4 mg via INTRAVENOUS
  Filled 2022-03-06: qty 2

## 2022-03-06 NOTE — ED Provider Notes (Signed)
[place] EMERGENCY DEPARTMENT Provider Note   CSN: [id number] Arrival date & time: 03/06/22  8563     History {Add pertinent medical, surgical, social history, OB history to HPI:1} Chief Complaint  Patient presents with   Shortness of Breath   Diarrhea   Dizziness    Brenda Douglas is a 66 y.o. female.  The history is provided by the patient and medical records.  Shortness of Breath Associated symptoms: cough and sore throat   Diarrhea Dizziness Associated symptoms: diarrhea and shortness of breath    66 y.o. F with hx of HTN, recurrent urticaria, presenting to the ED for URI symptoms.  Patient reports symptoms began 4 days ago (03/02/22).  Initially started as sore throat, congestion, and cough.  She went to the beach and came back with diarrhea and is now coughing up yellow phlegm.  States she and another co-worker were both sick, now it seems like everyone at her job is sick.  States she always wear a mask/gloves, however none of the other co-workers do.  She has not really been eating/drinking as it "runs through her" and she will have to stay in the bathroom for an hour.  She denies vomiting.  No headache, neck pain/stiffness.   Home Medications Prior to Admission medications   Medication Sig Start Date End Date Taking? Authorizing Provider  amLODipine (NORVASC) 2.5 MG tablet Take 2.5 mg by mouth daily. Patient not taking: Reported on 03/06/2022    [provider]  fexofenadine (ALLEGRA) 180 MG tablet Take 1 tablet (180 mg total) by mouth daily. Patient not taking: Reported on 03/06/2022 01/06/20   Garnet Sierras, DO  simvastatin (ZOCOR) 20 MG tablet SMARTSIG:1 Tablet(s) By Mouth Every Evening Patient not taking: Reported on 03/06/2022 10/28/19   [provider]      Allergies    Lisinopril    Review of Systems   Review of Systems  HENT:  Positive for congestion and sore throat.   Respiratory:  Positive for cough and shortness of  breath.   Gastrointestinal:  Positive for diarrhea.  Neurological:  Positive for dizziness.  All other systems reviewed and are negative.   Physical Exam Updated Vital Signs BP 110/77 (BP Location: Right Arm)   Pulse (!) 147   Temp 98.5 F (36.9 C) (Oral)   Resp 18   Ht 5' 7.5" (1.715 m)   Wt 72.1 kg   SpO2 95%   BMI 24.54 kg/m   Physical Exam Vitals and nursing note reviewed.  Constitutional:      Appearance: She is well-developed.     Comments: Seems tired  HENT:     Head: Normocephalic and atraumatic.  Eyes:     Conjunctiva/sclera: Conjunctivae normal.     Pupils: Pupils are equal, round, and reactive to light.  Cardiovascular:     Rate and Rhythm: Normal rate and regular rhythm.     Heart sounds: Normal heart sounds.  Pulmonary:     Effort: Pulmonary effort is normal.     Breath sounds: Normal breath sounds. No wheezing or rhonchi.  Abdominal:     General: Bowel sounds are normal.     Palpations: Abdomen is soft.  Musculoskeletal:        General: Normal range of motion.     Cervical back: Normal range of motion.  Skin:    General: Skin is warm and dry.  Neurological:     Mental Status: She is alert and oriented to person, place,  and time.     ED Results / Procedures / Treatments   Labs (all labs ordered are listed, but only abnormal results are displayed) Labs Reviewed  CBC WITH DIFFERENTIAL/PLATELET - Abnormal; Notable for the following components:      Result Value   WBC 17.3 (*)    RBC 5.42 (*)    Neutro Abs 14.5 (*)    Monocytes Absolute 1.6 (*)    Abs Immature Granulocytes 0.10 (*)    All other components within normal limits  BASIC METABOLIC PANEL - Abnormal; Notable for the following components:   Potassium 3.3 (*)    Chloride 96 (*)    CO2 21 (*)    Glucose, Bld 177 (*)    BUN 28 (*)    Creatinine, Ser 1.53 (*)    GFR, Estimated 38 (*)    Anion gap 19 (*)    All other components within normal limits  SARS CORONAVIRUS 2 BY RT PCR   CULTURE, BLOOD (ROUTINE X 2)  CULTURE, BLOOD (ROUTINE X 2)  RESPIRATORY PANEL BY PCR  URINE CULTURE  LACTIC ACID, PLASMA  LACTIC ACID, PLASMA  URINALYSIS, ROUTINE W REFLEX MICROSCOPIC    EKG None  Radiology DG Chest 2 View  Result Date: 03/06/2022 CLINICAL DATA:  Dyspnea EXAM: CHEST - 2 VIEW COMPARISON:  09/26/2015 FINDINGS: The heart size and mediastinal contours are within normal limits. Both lungs are clear. The visualized skeletal structures are unremarkable. IMPRESSION: No active cardiopulmonary disease. Electronically Signed   By: Helyn Numbers M.D.   On: 03/06/2022 20:54    Procedures Procedures  {Document cardiac monitor, telemetry assessment procedure when appropriate:1}  Medications Ordered in ED Medications  sodium chloride 0.9 % bolus 1,000 mL (has no administration in time range)  ondansetron (ZOFRAN) injection 4 mg (has no administration in time range)  acetaminophen (TYLENOL) tablet 1,000 mg (1,000 mg Oral Given 03/06/22 1951)    ED Course/ Medical Decision Making/ A&P                           Medical Decision Making Amount and/or Complexity of Data Reviewed Labs: ordered.  Risk Prescription drug management.   ***  {Document critical care time when appropriate:1} {Document review of labs and clinical decision tools ie heart score, Chads2Vasc2 etc:1}  {Document your independent review of radiology images, and any outside records:1} {Document your discussion with family members, caretakers, and with consultants:1} {Document social determinants of health affecting pt's care:1} {Document your decision making why or why not admission, treatments were needed:1} Final Clinical Impression(s) / ED Diagnoses Final diagnoses:  None    Rx / DC Orders ED Discharge Orders     None

## 2022-03-06 NOTE — ED Triage Notes (Signed)
Reports scratchy throat started on Thursday and went to the beach and has been sick since.  Reports sob, dizziness, diarrhea and tachy with low grade fever.

## 2022-03-06 NOTE — ED Provider Triage Note (Signed)
Emergency Medicine Provider Triage Evaluation Note  ABI SHOULTS , a 66 y.o. female  was evaluated in triage.  Pt complains of scratchy throat, congestion and subjective fevers.  Reports everybody at work is sick.  Some shortness of breath and says she is coughing up yellow stuff.  Review of Systems  Positive: Throat tightness, diarrhea, mild shortness of breath  Negative:   Physical Exam  BP 130/80 (BP Location: Right Arm)   Pulse (!) 157   Temp (!) 102.9 F (39.4 C) (Oral)   Resp 18   Ht 5' 7.5" (1.715 m)   Wt 72.1 kg   SpO2 93%   BMI 24.54 kg/m  Gen:   Awake, no distress   Resp:  Normal effort  MSK:   Moves extremities without difficulty  Other:  Lung sounds clear, tachycardic regular rhythm  Medical Decision Making  Medically screening exam initiated at 7:06 PM.  Appropriate orders placed.  EVELINE SAUVE was informed that the remainder of the evaluation will be completed by another provider, this initial triage assessment does not replace that evaluation, and the importance of remaining in the ED until their evaluation is complete.   Likely viral illness, Tylenol ordered       Rhae Hammock, PA-C 03/06/22 1908

## 2022-03-07 LAB — URINALYSIS, ROUTINE W REFLEX MICROSCOPIC
Bilirubin Urine: NEGATIVE
Glucose, UA: NEGATIVE mg/dL
Ketones, ur: 20 mg/dL — AB
Nitrite: NEGATIVE
Protein, ur: >=300 mg/dL — AB
Specific Gravity, Urine: 1.017 (ref 1.005–1.030)
pH: 5 (ref 5.0–8.0)

## 2022-03-07 LAB — RESPIRATORY PANEL BY PCR

## 2022-03-07 LAB — LACTIC ACID, PLASMA: Lactic Acid, Venous: 1 mmol/L (ref 0.5–1.9)

## 2022-03-07 NOTE — ED Notes (Signed)
RN reviewed discharge instructions with pt. Pt verbalized understanding and had no further questions. VSS upon discharge.  

## 2022-03-07 NOTE — Discharge Instructions (Signed)
Your tests today were positive for RSV.  This is likely causing your symptoms. Make sure to rest, hydrate.   Can use over the counter medications for cough (delsym, robitussin, etc) and diarrhea (imodium) Return to the ED for new or worsening symptoms.

## 2022-03-08 LAB — URINE CULTURE

## 2022-03-11 LAB — CULTURE, BLOOD (ROUTINE X 2)
Culture: NO GROWTH
Culture: NO GROWTH
Special Requests: ADEQUATE
Special Requests: ADEQUATE

## 2022-05-30 ENCOUNTER — Other Ambulatory Visit: Payer: Self-pay | Admitting: Family Medicine

## 2022-05-30 DIAGNOSIS — Z1231 Encounter for screening mammogram for malignant neoplasm of breast: Secondary | ICD-10-CM

## 2022-07-21 ENCOUNTER — Ambulatory Visit
Admission: RE | Admit: 2022-07-21 | Discharge: 2022-07-21 | Disposition: A | Discharge status: Home / Self Care | Payer: Commercial Managed Care - PPO | Source: Ambulatory Visit | Attending: Family Medicine | Admitting: Family Medicine

## 2022-07-21 DIAGNOSIS — Z1231 Encounter for screening mammogram for malignant neoplasm of breast: Secondary | ICD-10-CM

## 2022-08-11 ENCOUNTER — Other Ambulatory Visit: Payer: Self-pay | Admitting: Family Medicine

## 2022-08-11 DIAGNOSIS — M8580 Other specified disorders of bone density and structure, unspecified site: Secondary | ICD-10-CM

## 2023-07-14 ENCOUNTER — Emergency Department (HOSPITAL_COMMUNITY)

## 2023-07-14 ENCOUNTER — Other Ambulatory Visit: Payer: Self-pay

## 2023-07-14 ENCOUNTER — Emergency Department (HOSPITAL_COMMUNITY)
Admission: EM | Admit: 2023-07-14 | Discharge: 2023-07-14 | Disposition: A | Discharge status: Home / Self Care | Attending: Emergency Medicine | Admitting: Emergency Medicine

## 2023-07-14 ENCOUNTER — Encounter (HOSPITAL_COMMUNITY): Payer: Self-pay

## 2023-07-14 DIAGNOSIS — I10 Essential (primary) hypertension: Secondary | ICD-10-CM | POA: Insufficient documentation

## 2023-07-14 DIAGNOSIS — R200 Anesthesia of skin: Secondary | ICD-10-CM | POA: Diagnosis present

## 2023-07-14 LAB — CBC WITH DIFFERENTIAL/PLATELET
Abs Immature Granulocytes: 0.01 10*3/uL (ref 0.00–0.07)
Basophils Absolute: 0 10*3/uL (ref 0.0–0.1)
Basophils Relative: 0 %
Eosinophils Absolute: 0.1 10*3/uL (ref 0.0–0.5)
Eosinophils Relative: 1 %
HCT: 38.9 % (ref 36.0–46.0)
Hemoglobin: 12.3 g/dL (ref 12.0–15.0)
Immature Granulocytes: 0 %
Lymphocytes Relative: 37 %
Lymphs Abs: 2.1 10*3/uL (ref 0.7–4.0)
MCH: 27.1 pg (ref 26.0–34.0)
MCHC: 31.6 g/dL (ref 30.0–36.0)
MCV: 85.7 fL (ref 80.0–100.0)
Monocytes Absolute: 0.4 10*3/uL (ref 0.1–1.0)
Monocytes Relative: 7 %
Neutro Abs: 3.1 10*3/uL (ref 1.7–7.7)
Neutrophils Relative %: 55 %
Platelets: 292 10*3/uL (ref 150–400)
RBC: 4.54 MIL/uL (ref 3.87–5.11)
RDW: 13.2 % (ref 11.5–15.5)
WBC: 5.7 10*3/uL (ref 4.0–10.5)
nRBC: 0 % (ref 0.0–0.2)

## 2023-07-14 LAB — MAGNESIUM: Magnesium: 2.2 mg/dL (ref 1.7–2.4)

## 2023-07-14 LAB — COMPREHENSIVE METABOLIC PANEL
ALT: 29 U/L (ref 0–44)
AST: 22 U/L (ref 15–41)
Albumin: 4.3 g/dL (ref 3.5–5.0)
Alkaline Phosphatase: 52 U/L (ref 38–126)
Anion gap: 9 (ref 5–15)
BUN: 13 mg/dL (ref 8–23)
CO2: 26 mmol/L (ref 22–32)
Calcium: 9.6 mg/dL (ref 8.9–10.3)
Chloride: 102 mmol/L (ref 98–111)
Creatinine, Ser: 0.68 mg/dL (ref 0.44–1.00)
GFR, Estimated: >60 mL/min (ref >60)
Glucose, Bld: 118 mg/dL — ABNORMAL HIGH (ref 70–99)
Potassium: 3.9 mmol/L (ref 3.5–5.1)
Sodium: 137 mmol/L (ref 135–145)
Total Bilirubin: 0.7 mg/dL (ref 0.0–1.2)
Total Protein: 7.4 g/dL (ref 6.5–8.1)

## 2023-07-14 LAB — TROPONIN I (HIGH SENSITIVITY): Troponin I (High Sensitivity): <2 ng/L (ref <18)

## 2023-07-14 LAB — RESP PANEL BY RT-PCR (RSV, FLU A&B, COVID)  RVPGX2
Influenza A by PCR: NEGATIVE
Influenza B by PCR: NEGATIVE
Resp Syncytial Virus by PCR: NEGATIVE
SARS Coronavirus 2 by RT PCR: NEGATIVE

## 2023-07-14 MED ORDER — DIPHENHYDRAMINE HCL 50 MG/ML IJ SOLN
12.5000 mg | Freq: Once | INTRAMUSCULAR | Status: AC
  Administered 2023-07-14: 12.5 mg via INTRAVENOUS
  Filled 2023-07-14: qty 1

## 2023-07-14 MED ORDER — GADOBUTROL 1 MMOL/ML IV SOLN
7.0000 mL | Freq: Once | INTRAVENOUS | Status: AC | PRN
Start: 2023-07-14 — End: 2023-07-14
  Administered 2023-07-14: 7 mL via INTRAVENOUS

## 2023-07-14 NOTE — ED Provider Notes (Signed)
 Patient signed out to me by previous provider. Please refer to their note for full HPI.  Briefly this is a 68 year old female who presented the emergency department 4 days of left-sided facial numbness, pending MRI.  MRI of the brain without shows white matter changes in the right corona radiata.  This may be incidental but given its location and correlation with the left-sided facial numbness I consulted with neurology.  Dr. Amada Jupiter recommends getting an MRI of the brain with contrast.  He states that there is no enhancing lesion in this area that she may be discharged for outpatient follow-up.  MRI of the brain with contrast shows no acute finding.  Patient feels like her face is more swollen on the left side and in her mouth.  There is no evidence of swelling.  No evidence of allergic reaction or anaphylaxis.  No angioedema.  On reevaluation patient continues to have some paresthesias to the left side of the face but this will be followed up as an outpatient from a neurology standpoint.  Patient at this time appears safe and stable for discharge and close outpatient follow up. Discharge plan and strict return to ED precautions discussed, patient verbalizes understanding and agreement.   Rozelle Logan, DO 07/14/23 2219

## 2023-07-14 NOTE — ED Provider Triage Note (Signed)
 Emergency Medicine Provider Triage Evaluation Note  Brenda Douglas , a 68 y.o. female  was evaluated in triage.  Pt complains of left sided facial numbness.  She reports left-sided facial numbness for the past week.  Was using Orajel on the right upper lip for cold sore and that is when the left-sided facial numbness started.  No facial droop.  No weakness, slurred speech, vision changes.  Was advised to come here by urgent care for further evaluation.  Review of Systems  Positive: Left sided facial numbness Negative: Weakness, headaches, slurred speech,  Physical Exam  BP (!) 165/72 (BP Location: Right Arm)   Pulse (!) 47   Temp 97.8 F (36.6 C) (Oral)   Resp 16   Ht 5' 7.5" (1.715 m)   Wt 72.1 kg   SpO2 100%   BMI 24.53 kg/m  Gen:   Awake, no distress   Resp:  Normal effort  MSK:   Moves extremities without difficulty  Other:  No facial droop  Medical Decision Making  Medically screening exam initiated at 1:18 PM.  Appropriate orders placed.  Brenda Douglas was informed that the remainder of the evaluation will be completed by another provider, this initial triage assessment does not replace that evaluation, and the importance of remaining in the ED until their evaluation is complete.    Maxwell Marion, PA-C 07/14/23 1322

## 2023-07-14 NOTE — ED Provider Notes (Signed)
 [place] EMERGENCY DEPARTMENT AT [facility] Provider Note   CSN: [id number] Arrival date & time: 07/14/23  1257     History  Chief Complaint  Patient presents with   Shortness of Breath   Numbness    Brenda Douglas is a 68 y.o. female.  Patient is a 67 year old female with a past medical history of hypertension and hyperlipidemia presenting to the emergency department for left-sided facial numbness.  The patient states that she had a "cold sore" on her right upper lip/below her nose and she applied Orajel on Monday and Tuesday.  She states on Wednesday the entire left side of her face was numb.  She states that she spoke with her primary doctor who recommended that she wait for up to a week in case this was a side effect from the Orajel and to stop using the Orajel which she reports that she did.  She states that since she did not have any improvement of her symptoms that she wanted to be evaluated in the ED today.  She denies any facial weakness or numbness or weakness in her arms or legs.  She denies any difficulty speaking.  She states that her symptoms have been constant since they started.  The history is provided by the patient.  Shortness of Breath      Home Medications Prior to Admission medications   Medication Sig Start Date End Date Taking? Authorizing Provider  amLODipine (NORVASC) 2.5 MG tablet Take 2.5 mg by mouth daily. Patient not taking: Reported on 03/06/2022    [provider]  fexofenadine (ALLEGRA) 180 MG tablet Take 1 tablet (180 mg total) by mouth daily. Patient not taking: Reported on 03/06/2022 01/06/20   Ellamae Sia, DO  simvastatin (ZOCOR) 20 MG tablet SMARTSIG:1 Tablet(s) By Mouth Every Evening Patient not taking: Reported on 03/06/2022 10/28/19   [provider]      Allergies    Lisinopril    Review of Systems   Review of Systems  Respiratory:  Positive for shortness of breath.     Physical Exam Updated Vital  Signs BP (!) 165/72 (BP Location: Right Arm)   Pulse (!) 47   Temp 97.8 F (36.6 C) (Oral)   Resp 16   Ht 5' 7.5" (1.715 m)   Wt 72.1 kg   SpO2 100%   BMI 24.53 kg/m  Physical Exam Vitals and nursing note reviewed.  Constitutional:      General: She is not in acute distress.    Appearance: She is well-developed.  HENT:     Head: Normocephalic and atraumatic.     Mouth/Throat:     Mouth: Mucous membranes are moist.     Pharynx: Oropharynx is clear.     Comments: No intraoral lesions Eyes:     Extraocular Movements: Extraocular movements intact.     Conjunctiva/sclera: Conjunctivae normal.     Pupils: Pupils are equal, round, and reactive to light.  Cardiovascular:     Rate and Rhythm: Normal rate and regular rhythm.  Pulmonary:     Effort: Pulmonary effort is normal.     Breath sounds: Normal breath sounds.  Abdominal:     General: Abdomen is flat.     Palpations: Abdomen is soft.  Musculoskeletal:        General: Normal range of motion.     Cervical back: Normal range of motion and neck supple.     Right lower leg: No edema.     Left  lower leg: No edema.  Skin:    General: Skin is warm and dry.     Comments: Small scabbed appearing lesions distal to R nostril, no other lesions or rash to face  Neurological:     Mental Status: She is alert and oriented to person, place, and time.     Motor: No weakness.     Coordination: Coordination normal.     Comments: L-sided facial numbness, no appreciable facial droop Normal speech     ED Results / Procedures / Treatments   Labs (all labs ordered are listed, but only abnormal results are displayed) Labs Reviewed  COMPREHENSIVE METABOLIC PANEL - Abnormal; Notable for the following components:      Result Value   Glucose, Bld 118 (*)    All other components within normal limits  RESP PANEL BY RT-PCR (RSV, FLU A&B, COVID)  RVPGX2  MAGNESIUM  CBC WITH DIFFERENTIAL/PLATELET  TROPONIN I (HIGH SENSITIVITY)     EKG None  Radiology CT Head Wo Contrast Result Date: 07/14/2023 CLINICAL DATA:  Left facial numbness. EXAM: CT HEAD WITHOUT CONTRAST TECHNIQUE: Contiguous axial images were obtained from the base of the skull through the vertex without intravenous contrast. RADIATION DOSE REDUCTION: This exam was performed according to the departmental dose-optimization program which includes automated exposure control, adjustment of the mA and/or kV according to patient size and/or use of iterative reconstruction technique. COMPARISON:  None Available. FINDINGS: Brain: No evidence of intracranial hemorrhage, acute infarction, hydrocephalus, extra-axial collection, or mass lesion/mass effect. Vascular:  No hyperdense vessel or other acute findings. Skull: No evidence of fracture or other significant bone abnormality. Sinuses/Orbits:  No acute findings. Other: None. IMPRESSION: Negative noncontrast head CT. Electronically Signed   By: Danae Orleans M.D.   On: 07/14/2023 14:02   DG Chest 2 View Result Date: 07/14/2023 CLINICAL DATA:  SOB EXAM: CHEST - 2 VIEW COMPARISON:  March 06, 2022 FINDINGS: The cardiomediastinal silhouette is unchanged in contour. No pleural effusion. No pneumothorax. No acute pleuroparenchymal abnormality. Visualized abdomen is unremarkable. Mild degenerative changes of the thoracic spine. IMPRESSION: No acute cardiopulmonary abnormality. Electronically Signed   By: Meda Klinefelter M.D.   On: 07/14/2023 13:46    Procedures Procedures    Medications Ordered in ED Medications - No data to display  ED Course/ Medical Decision Making/ A&P Clinical Course as of 07/14/23 1537  Sat Jul 14, 2023  1537 Labs within normal range. Patient signed out to Dr. Wilkie Aye pending MRI. [VK]    Clinical Course User Index [VK] Rexford Maus, DO                                 Medical Decision Making This patient presents to the ED with chief complaint(s) of L facial numbness with pertinent  past medical history of HTN, HLD which further complicates the presenting complaint. The complaint involves an extensive differential diagnosis and also carries with it a high risk of complications and morbidity.    The differential diagnosis includes CVA, TIA, electrolyte abnormality, no motor dysfunction making a Bell's palsy unlikely, possible medication side effect though seems unlikely with inclusion of entire left side of face  Additional history obtained: Additional history obtained from N/A Records reviewed Care Everywhere/External Records  ED Course and Reassessment: On patient's arrival she is hemodynamically stable in no acute distress.  Does have left-sided facial numbness, no other appreciable deficits on exam.  Last known well  was 4 days ago and is outside the TNK window and will undergo subacute stroke workup.  She had labs initiated in triage with head CT.  Head CT showed no acute disease, labs are otherwise pending at this time.  Patient was recommended MRI to further evaluate for stroke.  Independent labs interpretation:  The following labs were independently interpreted: Within normal range  Independent visualization of imaging: - I independently visualized the following imaging with scope of interpretation limited to determining acute life threatening conditions related to emergency care: CT head, which revealed no acute disease    Amount and/or Complexity of Data Reviewed Radiology: ordered.          Final Clinical Impression(s) / ED Diagnoses Final diagnoses:  Left facial numbness    Rx / DC Orders ED Discharge Orders     None         Rexford Maus, DO 07/14/23 1537

## 2023-07-14 NOTE — ED Triage Notes (Signed)
 Pt c/o left sided facial numbness started 4 days ago. Pt states having SOB due to left sided facial numbness. Pt states she has nasal congestion at the top of her upper left nostril

## 2023-07-14 NOTE — Discharge Instructions (Signed)
 You have been seen and discharged from the emergency department.  Your MRI imaging of your brain showed no findings of stroke.  However is important to establish care with neurology as an outpatient follow-up for further evaluation and treatment.  Follow-up with your primary provider for further evaluation and further care. Take home medications as prescribed. If you have any worsening symptoms or further concerns for your health please return to an emergency department for further evaluation.

## 2023-07-14 NOTE — ED Notes (Signed)
 ED Provider at bedside.

## 2023-07-14 NOTE — ED Notes (Signed)
Dr. Horton at bedside. 

## 2023-07-14 NOTE — ED Notes (Signed)
 ..  The patient is A&OX4, ambulatory at d/c with independent steady gait, NAD. Pt verbalized understanding of d/c instructions and follow up care.

## 2023-07-14 NOTE — ED Notes (Signed)
 Pt in MRI.

## 2023-07-15 ENCOUNTER — Other Ambulatory Visit: Payer: Self-pay

## 2023-07-15 ENCOUNTER — Encounter (HOSPITAL_COMMUNITY): Payer: Self-pay | Admitting: Emergency Medicine

## 2023-07-15 ENCOUNTER — Emergency Department (HOSPITAL_COMMUNITY)
Admission: EM | Admit: 2023-07-15 | Discharge: 2023-07-16 | Disposition: A | Discharge status: Home / Self Care | Attending: Emergency Medicine | Admitting: Emergency Medicine

## 2023-07-15 DIAGNOSIS — R0981 Nasal congestion: Secondary | ICD-10-CM | POA: Diagnosis not present

## 2023-07-15 DIAGNOSIS — I10 Essential (primary) hypertension: Secondary | ICD-10-CM | POA: Diagnosis not present

## 2023-07-15 DIAGNOSIS — R200 Anesthesia of skin: Secondary | ICD-10-CM | POA: Diagnosis not present

## 2023-07-15 DIAGNOSIS — R600 Localized edema: Secondary | ICD-10-CM | POA: Diagnosis present

## 2023-07-15 NOTE — ED Triage Notes (Addendum)
 Pt in with reported L face swelling and numbness ongoing x 5 days. Pt states she feels like the swelling is moving into her R face. Seen yesterday for same and was given benadryl. Reports congestion as well, is anxious. No distress noted, no throat swelling, is managing secretions

## 2023-07-16 MED ORDER — DIPHENHYDRAMINE HCL 25 MG PO CAPS
25.0000 mg | ORAL_CAPSULE | Freq: Once | ORAL | Status: AC
  Administered 2023-07-16: 25 mg via ORAL
  Filled 2023-07-16: qty 1

## 2023-07-16 MED ORDER — OXYMETAZOLINE HCL 0.05 % NA SOLN
1.0000 | Freq: Once | NASAL | Status: AC
  Administered 2023-07-16: 1 via NASAL
  Filled 2023-07-16: qty 30

## 2023-07-16 NOTE — ED Provider Notes (Signed)
 [place] EMERGENCY DEPARTMENT AT [facility] Provider Note   CSN: [id number] Arrival date & time: 07/15/23  2258     History  Chief Complaint  Patient presents with   Facial Swelling    Brenda Douglas is a 68 y.o. female patient with past medical history of hypertension, [employer] area presenting to emergency room with complaint of left sided facial numbness and left nasal congestion.  She reports this has been ongoing for 5 days.  She reports that she took Benadryl which helped improve some of her facial numbness yesterday.  Reporting lateral decrease sensation of left side of her face.  Denies any shortness of breath, throat swelling, chest pain, difficulty handling secretions or with phonation.  HPI     Home Medications Prior to Admission medications   Medication Sig Start Date End Date Taking? Authorizing Provider  amLODipine (NORVASC) 2.5 MG tablet Take 2.5 mg by mouth daily. Patient not taking: Reported on 03/06/2022    [provider]  fexofenadine (ALLEGRA) 180 MG tablet Take 1 tablet (180 mg total) by mouth daily. Patient not taking: Reported on 03/06/2022 01/06/20   Ellamae Sia, DO  simvastatin (ZOCOR) 20 MG tablet SMARTSIG:1 Tablet(s) By Mouth Every Evening Patient not taking: Reported on 03/06/2022 10/28/19   [provider]      Allergies    Lisinopril    Review of Systems   Review of Systems  Neurological:  Negative for numbness.    Physical Exam Updated Vital Signs BP 132/77 (BP Location: Left Arm)   Pulse 82   Temp (!) 97.4 F (36.3 C)   Resp 16   Wt 72.1 kg   SpO2 100%   BMI 24.53 kg/m  Physical Exam Vitals and nursing note reviewed.  Constitutional:      General: She is not in acute distress.    Appearance: She is not toxic-appearing.  HENT:     Head: Normocephalic and atraumatic.     Comments: No uvula swelling. Uvular midline.  No oral swelling.  Eyes:     General: No scleral icterus.    Conjunctiva/sclera:  Conjunctivae normal.  Cardiovascular:     Rate and Rhythm: Normal rate and regular rhythm.     Pulses: Normal pulses.     Heart sounds: Normal heart sounds.  Pulmonary:     Effort: Pulmonary effort is normal. No respiratory distress.     Breath sounds: Normal breath sounds.  Abdominal:     General: Abdomen is flat. Bowel sounds are normal.     Palpations: Abdomen is soft.     Tenderness: There is no abdominal tenderness.  Musculoskeletal:     Right lower leg: No edema.     Left lower leg: No edema.  Skin:    General: Skin is warm and dry.     Findings: No lesion.  Neurological:     General: No focal deficit present.     Mental Status: She is alert and oriented to person, place, and time. Mental status is at baseline.     Comments: Decreased sensation to left side of face when compared to right.  No slurred speech or facial droop. No difficulty swallowing and with phonation.      ED Results / Procedures / Treatments   Labs (all labs ordered are listed, but only abnormal results are displayed) Labs Reviewed - No data to display  EKG None  Radiology MR BRAIN W CONTRAST Result Date: 07/14/2023 CLINICAL DATA:  Sensory abnormality, trigeminal  origin (CN 5) Reevaluating right sided corona radiata findings on MRI of the brain without EXAM: MRI HEAD WITH CONTRAST TECHNIQUE: Multiplanar, multiecho pulse sequences of the brain and surrounding structures were obtained with intravenous contrast. CONTRAST:  7mL GADAVIST GADOBUTROL 1 MMOL/ML IV SOLN COMPARISON:  Same day MRI head. FINDINGS: Only post contrast imaging was performed to further evaluate. Noncontrast MRI Head was performed earlier today. No pathologic enhancement. IMPRESSION: No pathologic enhancement. Electronically Signed   By: Feliberto Harts M.D.   On: 07/14/2023 21:09   MR Brain Wo Contrast (neuro protocol) Result Date: 07/14/2023 CLINICAL DATA:  Neuro deficit.  Left facial numbness. EXAM: MRI HEAD WITHOUT CONTRAST TECHNIQUE:  Multiplanar, multiecho pulse sequences of the brain and surrounding structures were obtained without intravenous contrast. COMPARISON:  CT head without contrast 08/03/2023. FINDINGS: Brain: No acute infarct, hemorrhage, or mass lesion is present. Minimal white matter changes are noted in the right corona radiata. Deep brain nuclei are within normal limits. The ventricles are of normal size. No significant extraaxial fluid collection is present. The brainstem and cerebellum are within normal limits. Midline structures are within normal limits. Vascular: Flow is present in the major intracranial arteries. Skull and upper cervical spine: Degenerative changes are present the upper cervical spine. Craniocervical junction is normal. Marrow signal is within normal limits. Sinuses/Orbits: The paranasal sinuses and mastoid air cells are clear. The globes and orbits are within normal limits. IMPRESSION: 1. No acute intracranial abnormality or significant interval change. 2. Minimal white matter changes in the right corona radiata are nonspecific but likely reflect the sequela of chronic microvascular ischemia. Electronically Signed   By: Marin Roberts M.D.   On: 07/14/2023 15:57   CT Head Wo Contrast Result Date: 07/14/2023 CLINICAL DATA:  Left facial numbness. EXAM: CT HEAD WITHOUT CONTRAST TECHNIQUE: Contiguous axial images were obtained from the base of the skull through the vertex without intravenous contrast. RADIATION DOSE REDUCTION: This exam was performed according to the departmental dose-optimization program which includes automated exposure control, adjustment of the mA and/or kV according to patient size and/or use of iterative reconstruction technique. COMPARISON:  None Available. FINDINGS: Brain: No evidence of intracranial hemorrhage, acute infarction, hydrocephalus, extra-axial collection, or mass lesion/mass effect. Vascular:  No hyperdense vessel or other acute findings. Skull: No evidence of  fracture or other significant bone abnormality. Sinuses/Orbits:  No acute findings. Other: None. IMPRESSION: Negative noncontrast head CT. Electronically Signed   By: Danae Orleans M.D.   On: 07/14/2023 14:02   DG Chest 2 View Result Date: 07/14/2023 CLINICAL DATA:  SOB EXAM: CHEST - 2 VIEW COMPARISON:  March 06, 2022 FINDINGS: The cardiomediastinal silhouette is unchanged in contour. No pleural effusion. No pneumothorax. No acute pleuroparenchymal abnormality. Visualized abdomen is unremarkable. Mild degenerative changes of the thoracic spine. IMPRESSION: No acute cardiopulmonary abnormality. Electronically Signed   By: Meda Klinefelter M.D.   On: 07/14/2023 13:46    Procedures Procedures    Medications Ordered in ED Medications - No data to display  ED Course/ Medical Decision Making/ A&P                                 Medical Decision Making Risk OTC drugs.   This patient presents to the ED for concern of left sided facial numbess, this involves an extensive number of treatment options, and is a complaint that carries with it a high risk of complications and morbidity.  The  differential diagnosis include CVA, lesion, Bell's palsy, viral URI, medication side effect, peritonsillar abscess, Ludwig's angina, cellulitis  Reviewed visit 07/14/2023  Problem List / ED Course / Critical interventions / Medication management  Patient reporting to emergency room with complaint of left facial numbness and left nasal congestion.  Reports this started 5 days ago.  She was evaluated here for her symptoms 2 days ago and has no significant change.  At time of initial evaluation she had CT head, MRI and MRI with contrast.  Her workup was negative for stroke or acute abnormalities.  Given reassuring stroke workup two days ago and no change in symptoms do not feel repeat imaging needed today.  Patient has ambulatory referral to neurology for follow-up for left facial numbness.  She continues to be alert  oriented with no slurred speech, no change in vision, no nystagmus, no upper or lower extremity weakness sensation of upper and lower extremity intact.  She has no oral swelling.  No difficulty managing secretions or with phonation.  She is in no acute distress.  There is no facial swelling seen on exam.  Patient does complain of congestion -will give Afrin.  Will also give Benadryl.  Patient has appropriate follow-up.  She is hemodynamically stable and well-appearing.  Appropriate for discharge. I ordered medication including Afrin, Benadryl Reevaluation of the patient after these medicines showed that the patient improved I have reviewed the patients home medicines and have made adjustments as needed   Plan  F/u w/ PCP in 2-3d to ensure resolution of sx.  Patient was given return precautions. Patient stable for discharge at this time.  Patient educated on sx/dx and verbalized understanding of plan. Return to ER w/ new or worsening sx.          Final Clinical Impression(s) / ED Diagnoses Final diagnoses:  Nasal congestion  Left facial numbness    Rx / DC Orders ED Discharge Orders          Ordered    Ambulatory referral to Neurology       Comments: An appointment is requested in approximately: 4 weeks   07/16/23 0942              Smitty Knudsen, PA-C 07/16/23 1037    Margarita Grizzle, MD 07/16/23 [phone]

## 2023-07-16 NOTE — Discharge Instructions (Addendum)
 Please continue to take benadryl as needed for facial swelling, congestion. You can also use Flonase or OTC Claritin or Zyrtec.  You can use Afrin for nasal congestion 2 times a day, for three days. After this time period, please discontinue the medication. You should not use with medications for more than 3 days.   Follow up with neurology to discussed further care for left sided facial numbness.   Return with new or worsening symptoms.

## 2023-07-19 ENCOUNTER — Encounter: Payer: Self-pay | Admitting: Neurology

## 2023-07-20 ENCOUNTER — Emergency Department (HOSPITAL_COMMUNITY)
Admission: EM | Admit: 2023-07-20 | Discharge: 2023-07-20 | Disposition: A | Discharge status: Home / Self Care | Attending: Emergency Medicine | Admitting: Emergency Medicine

## 2023-07-20 ENCOUNTER — Emergency Department (HOSPITAL_COMMUNITY)

## 2023-07-20 ENCOUNTER — Other Ambulatory Visit: Payer: Self-pay

## 2023-07-20 DIAGNOSIS — R202 Paresthesia of skin: Secondary | ICD-10-CM | POA: Diagnosis present

## 2023-07-20 LAB — CBC WITH DIFFERENTIAL/PLATELET
Abs Immature Granulocytes: 0.01 10*3/uL (ref 0.00–0.07)
Basophils Absolute: 0 10*3/uL (ref 0.0–0.1)
Basophils Relative: 0 %
Eosinophils Absolute: 0.1 10*3/uL (ref 0.0–0.5)
Eosinophils Relative: 1 %
HCT: 47.1 % — ABNORMAL HIGH (ref 36.0–46.0)
Hemoglobin: 14.9 g/dL (ref 12.0–15.0)
Immature Granulocytes: 0 %
Lymphocytes Relative: 30 %
Lymphs Abs: 1.9 10*3/uL (ref 0.7–4.0)
MCH: 26.5 pg (ref 26.0–34.0)
MCHC: 31.6 g/dL (ref 30.0–36.0)
MCV: 83.8 fL (ref 80.0–100.0)
Monocytes Absolute: 0.5 10*3/uL (ref 0.1–1.0)
Monocytes Relative: 8 %
Neutro Abs: 3.9 10*3/uL (ref 1.7–7.7)
Neutrophils Relative %: 61 %
Platelets: 321 10*3/uL (ref 150–400)
RBC: 5.62 MIL/uL — ABNORMAL HIGH (ref 3.87–5.11)
RDW: 13 % (ref 11.5–15.5)
WBC: 6.4 10*3/uL (ref 4.0–10.5)
nRBC: 0 % (ref 0.0–0.2)

## 2023-07-20 LAB — BASIC METABOLIC PANEL
Anion gap: 14 (ref 5–15)
BUN: 21 mg/dL (ref 8–23)
CO2: 25 mmol/L (ref 22–32)
Calcium: 10.2 mg/dL (ref 8.9–10.3)
Chloride: 99 mmol/L (ref 98–111)
Creatinine, Ser: 0.95 mg/dL (ref 0.44–1.00)
GFR, Estimated: >60 mL/min (ref >60)
Glucose, Bld: 130 mg/dL — ABNORMAL HIGH (ref 70–99)
Potassium: 3.7 mmol/L (ref 3.5–5.1)
Sodium: 138 mmol/L (ref 135–145)

## 2023-07-20 LAB — MAGNESIUM: Magnesium: 2.3 mg/dL (ref 1.7–2.4)

## 2023-07-20 MED ORDER — IOHEXOL 300 MG/ML  SOLN
75.0000 mL | Freq: Once | INTRAMUSCULAR | Status: AC | PRN
  Administered 2023-07-20: 75 mL via INTRAVENOUS

## 2023-07-20 NOTE — ED Triage Notes (Signed)
 Pt arrived via POV. C/o L facial numbness that has been going on for over a week. Pt has been seen multiple times for same.  Now c/o pins and needles in L leg.

## 2023-07-20 NOTE — ED Provider Notes (Addendum)
 [place] EMERGENCY DEPARTMENT AT [facility] Provider Note   CSN: [id number] Arrival date & time: 07/20/23  0701     History  Chief Complaint  Patient presents with   facial numbness   Tingling    Brenda Douglas is a 68 y.o. female.  This is a 68 year old female who is presenting to the emergency department today due to left-sided facial numbness and swelling.  Patient reports that it seems to get worse in the evening, and at night "my face gets swelled up."  Patient says that she is concerned about her sinuses.  She also endorses tingling in her right lower extremity.  Patient has been seen here previously, had an MRI with and without contrast of the brain done within 1 week.  Patient also reports having a cold sore 2 weeks ago.        Home Medications Prior to Admission medications   Medication Sig Start Date End Date Taking? Authorizing Provider  amLODipine (NORVASC) 2.5 MG tablet Take 2.5 mg by mouth daily. Patient not taking: Reported on 03/06/2022    [provider]  fexofenadine (ALLEGRA) 180 MG tablet Take 1 tablet (180 mg total) by mouth daily. Patient not taking: Reported on 03/06/2022 01/06/20   Ellamae Sia, DO  simvastatin (ZOCOR) 20 MG tablet SMARTSIG:1 Tablet(s) By Mouth Every Evening Patient not taking: Reported on 03/06/2022 10/28/19   [provider]      Allergies    Lisinopril    Review of Systems   Review of Systems  Physical Exam Updated Vital Signs BP (!) 154/86 (BP Location: Left Arm)   Pulse 73   Temp 97.9 F (36.6 C) (Oral)   Resp 18   Ht 5\' 6"  (1.676 m)   Wt 57.2 kg   SpO2 100%   BMI 20.34 kg/m  Physical Exam Vitals reviewed.  HENT:     Head: Normocephalic.     Right Ear: Tympanic membrane normal.     Left Ear: Tympanic membrane normal.     Nose: Nose normal.     Mouth/Throat:     Mouth: Mucous membranes are moist.     Pharynx: No oropharyngeal exudate or posterior oropharyngeal erythema.   Eyes:     Pupils: Pupils are equal, round, and reactive to light.  Musculoskeletal:     Cervical back: Normal range of motion.  Neurological:     General: No focal deficit present.     Mental Status: She is alert and oriented to person, place, and time.     Cranial Nerves: No cranial nerve deficit.     Motor: No weakness.     Gait: Gait normal.     ED Results / Procedures / Treatments   Labs (all labs ordered are listed, but only abnormal results are displayed) Labs Reviewed  BASIC METABOLIC PANEL - Abnormal; Notable for the following components:      Result Value   Glucose, Bld 130 (*)    All other components within normal limits  CBC WITH DIFFERENTIAL/PLATELET - Abnormal; Notable for the following components:   RBC 5.62 (*)    HCT 47.1 (*)    All other components within normal limits  MAGNESIUM    EKG None  Radiology CT Maxillofacial W Contrast Result Date: 07/20/2023 CLINICAL DATA:  Sinus or nasal mass, left facial numbness. EXAM: CT MAXILLOFACIAL WITH CONTRAST TECHNIQUE: Multidetector CT imaging of the maxillofacial structures was performed with intravenous contrast. Multiplanar CT image reconstructions were also generated. RADIATION  DOSE REDUCTION: This exam was performed according to the departmental dose-optimization program which includes automated exposure control, adjustment of the mA and/or kV according to patient size and/or use of iterative reconstruction technique. CONTRAST:  75mL OMNIPAQUE IOHEXOL 300 MG/ML  SOLN COMPARISON:  MRI head 07/14/2023 FINDINGS: Osseous: No evidence of acute maxillofacial fracture. Mild irregularity of the right nasal bone without overlying soft tissue swelling likely reflecting chronic fracture. No mandibular dislocation. No destructive process. Orbits: The globes are intact. Lenses are normally located. Normal appearance of the extraocular muscles and optic nerve sheath complexes. Normal appearance of the retro bulbar fat. Sinuses: Mild  mucosal thickening in the ethmoid sinuses. No air-fluid levels. There is pneumatization of the sphenoid wings and tear weight process on the left as well as partial pneumatization of the body of the sphenoid on the left. There is thinning of bone along the left foramen rotundum with possible dehiscence along the inferior aspect of the foramen rotundum along the superior margin of the left sphenoid sinus. The left vidian nerve also extends into the inferior aspect of the left sphenoid sinus along and osseous septation with possible dehiscence of the vidian canal. Soft tissues: The superficial facial soft tissues are unremarkable without evidence of inflammatory process. Soft tissues in the deep spaces of the visualized neck are unremarkable. No evidence of mass or abnormal soft tissue involving the nasal cavity. Limited intracranial: Limited visualization of intracranial structures without focal abnormality. IMPRESSION: No evidence of mass in the facial soft tissues, paranasal sinuses, or within the nasal cavity. Extensive pneumatization of the left aspect of the sphenoid bone as described above. There is thinning of bone along the left foramen rotundum with possible dehiscence along the inferior aspect of the foramen rotundum within the superior aspect of the left sphenoid sinus. Electronically Signed   By: Emily Filbert M.D.   On: 07/20/2023 12:03    Procedures Procedures    Medications Ordered in ED Medications  iohexol (OMNIPAQUE) 300 MG/ML solution 75 mL (75 mLs Intravenous Contrast Given 07/20/23 1032)    ED Course/ Medical Decision Making/ A&P                                 Medical Decision Making 68 year old female here today for facial swelling, numbness.  Also endorses tingling in her right lower extremity.  Plan-my assessment of the patient, do not appreciate any swelling of the face.  Send the patient's ear, nares, mouth, no swelling.  Patient denies any visual disturbances, no  tenderness over the temple.  Symptoms do not sound consistent with a trigeminal neuralgia.  Reviewed the patient's previous MRIs.  Will obtain CT imaging of patient's sinuses.  Will check basic blood work and a magnesium.  Husband 12:20 PM-patient's CBC and BMP normal.  Magnesium also not abnormal.  Patient CT max face negative for any acute process.  There is some narrowing of her vidian canal.  Discussed this with the patient.  Patient requested antibiotics for possible infection.  Stating that no one did a swab of her mouth.  I again looked in the patient's oropharynx, there is no evidence of infection, explained to the patient CT imaging.  Patient then discussed the area on her right lower leg that she said felt funny, and was wondering if she would be able to work with this.  Again reiterated to the patient her previous workup, her workup today and results, and stressed the  importance of following up with her PCP.  At this time, does not appear the patient has an emergent medical condition.    She is going to follow-up with her PCP.  Amount and/or Complexity of Data Reviewed Labs: ordered. Radiology: ordered.  Risk Prescription drug management.           Final Clinical Impression(s) / ED Diagnoses Final diagnoses:  Paresthesia    Rx / DC Orders ED Discharge Orders     None         Arletha Pili, DO 07/20/23 1224    Anders Simmonds T, DO 07/20/23 1230

## 2023-07-20 NOTE — Discharge Instructions (Signed)
 While you are in the emergency room you had blood work done that was normal.  Your CT scan of your face showed a narrowing of a canal that holds the nerve in your face.  Sometimes this can cause some numbness around your mouth and face.  Please follow-up with your primary care doctor to discuss this further.  Symptoms may improve over the next few weeks.

## 2023-07-30 NOTE — Progress Notes (Unsigned)
 [facility] Clinic Note - Initial Visit   Date: 07/31/2023   Brenda Douglas MRN: [id number] DOB: 30-Sep-1955   Dear Dr. Wynelle Link:  Thank you for your kind referral of Brenda Douglas for consultation of left facial numbness. Although her history is well known to you, please allow [place] to reiterate it for the purpose of our medical record. The patient was accompanied to the clinic by self.    Brenda Douglas is a 68 y.o. right-handed female with hypertension, hyperlipidemia, and allergies presenting for evaluation of left facial numbness and new complaints of dizziness.   IMPRESSION/PLAN: Benign paroxsymal peripheral vertigo  - Refer for vestibular therapy  - Start meclizine 25mg  TID prn dizziness  2.  Left facial paresthesia.  MRI brain without structural pathology to explain symptoms.   - Check ESR, CRP, ANA, SSA/B, vitamin B12, folate, TSH  3.  Right leg paresthesia  - NCS/EMG of the right leg  4.  Orthostasis with drop in SBP ~ 30pt.    - Encouraged hydration.  She reports that her nausea/dizziness has prevented her from keeping fluid and food down.  If she is unable to take PO fluids, recommend she go to the ER for IV fluids.   Further recommendations pending results.   ------------------------------------------------------------- History of present illness: Starting in early March, she began having numbness involving the left forehead and cheek.  Symptoms are worse in the evening and feels as if it is swollen inside her mouth.  She has been to the ER three times for these symptoms and had MRI brain wwo contrast which does not show any intracranial pathology to explain her symptoms. She also complains of tingling over the lateral side of the right lower leg and thigh.  Also over the past 3 weeks, she reports having dizziness, nausea, and instability.  Dizziness is described as room-spinning and worse with head movement or positional changes.  She  starting using a cane.  It took two clinical staff personnel to assist patient to the exam room today.   Out-side paper records, electronic medical record, and images have been reviewed where available and summarized as:  MRI brain wwo contrast 07/14/2023: 1. No acute intracranial abnormality or significant interval change. 2. Minimal white matter changes in the right corona radiata are nonspecific but likely reflect the sequela of chronic microvascular ischemia. 3. No pathological enhancement  Lab Results  Component Value Date   TSH 2.480 01/06/2020   Lab Results  Component Value Date   ESRSEDRATE 37 01/06/2020   POCTSEDRATE 40 (A) 11/10/2011    Past Medical History:  Diagnosis Date   Angio-edema    Urticaria     Past Surgical History:  Procedure Laterality Date   BACK SURGERY       Medications:  Outpatient Encounter Medications as of 07/31/2023  Medication Sig   meclizine (ANTIVERT) 25 MG tablet Take 1 tablet (25 mg total) by mouth 3 (three) times daily as needed for dizziness.   metoprolol succinate (TOPROL-XL) 100 MG 24 hr tablet Take 100 mg by mouth daily. (Patient not taking: Reported on 07/31/2023)   simvastatin (ZOCOR) 20 MG tablet SMARTSIG:1 Tablet(s) By Mouth Every Evening (Patient not taking: Reported on 07/31/2023)   [DISCONTINUED] amLODipine (NORVASC) 2.5 MG tablet Take 2.5 mg by mouth daily. (Patient not taking: Reported on 03/06/2022)   [DISCONTINUED] fexofenadine (ALLEGRA) 180 MG tablet Take 1 tablet (180 mg total) by mouth daily. (Patient not taking: Reported on 03/06/2022)   No facility-administered  encounter medications on file as of 07/31/2023.    Allergies:  Allergies  Allergen Reactions   Lisinopril     Potential angioedema    Family History: Family History  Problem Relation Age of Onset   Diabetes Mother    Diabetes Brother    Asthma Neg Hx    Eczema Neg Hx    Urticaria Neg Hx    Allergic rhinitis Neg Hx     Social History: Social  History   Tobacco Use   Smoking status: Former    Types: Cigarettes   Smokeless tobacco: Never  Vaping Use   Vaping status: Never Used  Substance Use Topics   Alcohol use: No   Drug use: No   Social History   Social History Narrative   Are you right handed or left handed? Right    Are you currently employed ?  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 Alone    Who lives with you?    What type of home do you live in: 1 story or 2 story? 1 story         Vital Signs:  BP (!) 135/95 (Patient Position: Standing)   Pulse 75   Ht 5\' 7"  (1.702 m)   Wt 124 lb (56.2 kg)   SpO2 100%   BMI 19.42 kg/m    General Medical Exam:   General:  Mild distress, she had cold wash cloth on her forehead and is intermittently dry heaving during the first part of the visit.   Neurological Exam: MENTAL STATUS including orientation to time, place, person, recent and remote memory, attention span and concentration, language, and fund of knowledge is normal.  Speech is not dysarthric.  CRANIAL NERVES: II:  No visual field defects.     III-IV-VI: Pupils equal round and reactive to light.  Normal conjugate, extra-ocular eye movements in all directions of gaze.  Nystagmus is present with right gaze.  No ptosis.   V:  Patient reports reduced vibration, temperature, pin prick over the left forehead and cheek.  Sensation intact on the right.   VII:  Normal facial symmetry and movements.   VIII:  Normal hearing and vestibular function.   IX-X:  Normal palatal movement.   XI:  Normal shoulder shrug and head rotation.   XII:  Normal tongue strength and range of motion, no deviation or fasciculation.  MOTOR:  Motor strength is 5/5 throughout. No atrophy, fasciculations or abnormal movements.  No pronator drift.   MSRs:                                           Right        Left brachioradialis 2+  2+  biceps 2+  2+  triceps 2+  2+  patellar  2+  2+  ankle jerk 2+  2+   SENSORY:  Normal and symmetric perception of light touch, pinprick, vibration, and pin prick on the limbs.   COORDINATION/GAIT: Normal finger-to- nose-finger.  Intact rapid alternating movements bilaterally. Severely unsteady gait, assisted with one person and cane.    Thank you for allowing me to participate in patient's care.  If I can answer any additional questions, I would be pleased to do so.    Sincerely,    Roben Tatsch K. Allena Katz, DO

## 2023-07-31 ENCOUNTER — Other Ambulatory Visit

## 2023-07-31 ENCOUNTER — Encounter: Payer: Self-pay | Admitting: Neurology

## 2023-07-31 ENCOUNTER — Ambulatory Visit (INDEPENDENT_AMBULATORY_CARE_PROVIDER_SITE_OTHER): Admitting: Neurology

## 2023-07-31 VITALS — BP 135/95 | HR 75 | Ht 67.0 in | Wt 124.0 lb

## 2023-07-31 DIAGNOSIS — H8111 Benign paroxysmal vertigo, right ear: Secondary | ICD-10-CM | POA: Diagnosis not present

## 2023-07-31 DIAGNOSIS — R200 Anesthesia of skin: Secondary | ICD-10-CM | POA: Diagnosis not present

## 2023-07-31 DIAGNOSIS — R202 Paresthesia of skin: Secondary | ICD-10-CM

## 2023-07-31 DIAGNOSIS — I951 Orthostatic hypotension: Secondary | ICD-10-CM

## 2023-07-31 MED ORDER — MECLIZINE HCL 25 MG PO TABS: 25.0000 mg | ORAL_TABLET | Freq: Three times a day (TID) | ORAL | 1 refills | Status: AC | PRN

## 2023-07-31 NOTE — Patient Instructions (Signed)
 Check labs  Start meclizine 25mg  three times daily as needed for dizziness  We will refer you for out-patient therapy for vertigo  Recommend that you increase hydration

## 2023-08-02 ENCOUNTER — Telehealth: Payer: Self-pay | Admitting: Neurology

## 2023-08-02 LAB — C-REACTIVE PROTEIN: CRP: <3 mg/L (ref <8.0)

## 2023-08-02 LAB — SJOGREN'S SYNDROME ANTIBODS(SSA + SSB)
SSA (Ro) (ENA) Antibody, IgG: <1 AI
SSB (La) (ENA) Antibody, IgG: <1 AI

## 2023-08-02 LAB — VITAMIN B12: Vitamin B-12: 573 pg/mL (ref 200–1100)

## 2023-08-02 LAB — ANTI-NUCLEAR AB-TITER (ANA TITER): ANA Titer 1: 1:40 {titer} — ABNORMAL HIGH

## 2023-08-02 LAB — TSH: TSH: 1.42 m[IU]/L (ref 0.40–4.50)

## 2023-08-02 LAB — FOLATE: Folate: 6 ng/mL

## 2023-08-02 LAB — ANA: Anti Nuclear Antibody (ANA): POSITIVE — AB

## 2023-08-02 LAB — SEDIMENTATION RATE: Sed Rate: 19 mm/h (ref 0–30)

## 2023-08-02 NOTE — Telephone Encounter (Signed)
 Patient called and states that she is starting PT on Monday and needs a work note to be out of work   she is not feeling better

## 2023-08-02 NOTE — Telephone Encounter (Signed)
 Called pt and told her that she can get the note from them. She understood. Then she mentioned that she want to start it at home. She is going Monday to the PT appointment , I told her to call back and let us know which one she wants to do . She understood.

## 2023-08-06 ENCOUNTER — Telehealth: Payer: Self-pay

## 2023-08-06 ENCOUNTER — Encounter: Payer: Self-pay | Admitting: Physical Therapy

## 2023-08-06 ENCOUNTER — Ambulatory Visit: Attending: Neurology | Admitting: Physical Therapy

## 2023-08-06 VITALS — BP 146/72 | HR 47

## 2023-08-06 DIAGNOSIS — R42 Dizziness and giddiness: Secondary | ICD-10-CM | POA: Diagnosis present

## 2023-08-06 DIAGNOSIS — H8111 Benign paroxysmal vertigo, right ear: Secondary | ICD-10-CM | POA: Diagnosis present

## 2023-08-06 DIAGNOSIS — R2681 Unsteadiness on feet: Secondary | ICD-10-CM | POA: Insufficient documentation

## 2023-08-06 NOTE — Therapy (Signed)
 OUTPATIENT PHYSICAL THERAPY VESTIBULAR EVALUATION     Patient Name: Brenda Douglas MRN: [id number] DOB:05-25-1955, 68 y.o., female Today's Date: 08/06/2023  END OF SESSION:  PT End of Session - 08/06/23 1027     Visit Number 1    Number of Visits 16    Date for PT Re-Evaluation 08/31/23    Authorization Type [employer]    PT Start Time 0920    PT Stop Time 1015    PT Time Calculation (min) 55 min    Activity Tolerance Patient tolerated treatment well    Behavior During Therapy WFL for tasks assessed/performed             Past Medical History:  Diagnosis Date   Angio-edema    Urticaria    Past Surgical History:  Procedure Laterality Date   BACK SURGERY     Patient Active Problem List   Diagnosis Date Noted   Hypertension 02/02/2020   Urticaria 01/06/2020    PCP: Deatra James, MD REFERRING PROVIDER: Glendale Chard, DO  REFERRING DIAG: Diagnosis H81.11 (ICD-10-CM) - Benign paroxysmal positional vertigo of right ear  THERAPY DIAG:  Dizziness and giddiness  BPPV (benign paroxysmal positional vertigo), right  Unsteadiness on feet  ONSET DATE: 07-14-23  Rationale for Evaluation and Treatment: Rehabilitation  SUBJECTIVE:   SUBJECTIVE STATEMENT:  Pt reports she took Meclizine around 3 AM this morning Pt has been to ED 3 times in past month with dizziness/paresthesias Lt side of face  Pt reports the dizziness started about 3 weeks ago - pt reports she woke up with dizziness; had nausea and vomiting with it so she quit drinking; says she has increased her fluid intake but it is not back to normal at this time; pt reports the dizziness is when she is up and walking - but still feels it in sitting; states best position is lying down  Pt accompanied by: self  PERTINENT HISTORY: History of present illness: Starting in early March, she began having numbness involving the left forehead and cheek.  Symptoms are worse in the evening and feels as if it is swollen inside her  mouth.  She has been to the ER three times for these symptoms and had MRI brain wwo contrast which does not show any intracranial pathology to explain her symptoms. She also complains of tingling over the lateral side of the right lower leg and thigh.  Also over the past 3 weeks, she reports having dizziness, nausea, and instability.  Dizziness is described as room-spinning and worse with head movement or positional changes.  She starting using a cane.  It took two clinical staff personnel to assist patient to the exam room today.   HTN, Angio-edema  PAIN:  Are you having pain? No  PRECAUTIONS: Fall and Other: dizziness (mostly constant)  RED FLAGS: None   WEIGHT BEARING RESTRICTIONS: No  FALLS: Has patient fallen in last 6 months? No   PLOF: Independent  PATIENT GOALS: resolve the vertigo  OBJECTIVE:  Note: Objective measures were completed at Evaluation unless otherwise noted.  DIAGNOSTIC FINDINGS: MRI brain wwo contrast 07/14/2023: 1. No acute intracranial abnormality or significant interval change. 2. Minimal white matter changes in the right corona radiata are nonspecific but likely reflect the sequela of chronic microvascular ischemia. 3. No pathological enhancement  COGNITION: Overall cognitive status: Within functional limits for tasks assessed   SENSATION: Pt reports paresthesia Lt side of face  POSTURE:  No Significant postural limitations  Cervical ROM:  WNL's  BED MOBILITY:  Independent  TRANSFERS: Assistive device utilized: Single point cane  Sit to stand: Modified independence Stand to sit: Modified independence   GAIT: Gait pattern: step through pattern Distance walked: 50' Assistive device utilized: Single point cane Level of assistance: CGA Comments: slow speed due to c/o dizziness  FUNCTIONAL TESTS:  See vestibular assessment  VESTIBULAR ASSESSMENT:  GENERAL OBSERVATION: Pt amb. Very slowly with [facility] - states the dizziness has gotten  more intense since it started; was independent in all activities prior to onset on 07-14-23   SYMPTOM BEHAVIOR:  Subjective history: pt reports the left side of her face is swollen - unable to note edema by observation; had dental appt 2 weeks for removal of tooth (was possibly hitting a nerve)  Non-Vestibular symptoms: nausea/vomiting  Type of dizziness: Spinning/Vertigo, Unsteady with head/body turns, and "World moves"  Frequency: occurs daily - intensity varies  Duration: constant - varies in intensity  Aggravating factors: Induced by motion: occur when walking, sitting in a moving car, and activity in general  Relieving factors: lying supine  Progression of symptoms:  was severe when initially started - says it has eased off  OCULOMOTOR EXAM:  Ocular Alignment: normal  Ocular ROM: No Limitations  Spontaneous Nystagmus: absent  Gaze-Induced Nystagmus: absent  Smooth Pursuits: intact c/o dizziness with testing - >dizziness reported with vertical smooth pursuit than with horizontal smooth pursuit  Saccades: intact    FRENZEL - FIXATION SUPRESSED:  Ocular Alignment: normal  Spontaneous Nystagmus: absent  Gaze-Induced Nystagmus: absent   POSITIONAL TESTING: Right Dix-Hallpike: questionable suppressed nystagmus (pt took Meclizine at 3 AM this morning) - pt reported no increase in dizziness in this test position Left Dix-Hallpike: no nystagmus Right Roll Test: no nystagmus Left Roll Test: no nystagmus Right Sidelying: no nystagmus Left Sidelying: no nystagmus  MOTION SENSITIVITY:  Motion Sensitivity Quotient Intensity: 0 = none, 1 = Lightheaded, 2 = Mild, 3 = Moderate, 4 = Severe, 5 = Vomiting  Intensity  1. Sitting to supine   2. Supine to L side   3. Supine to R side   4. Supine to sitting 0  5. L Hallpike-Dix 2  6. Up from L  0  7. R Hallpike-Dix 2  8. Up from R  0  9. Sitting, head tipped to L knee   10. Head up from L knee   11. Sitting, head tipped to R knee   12.  Head up from R knee   13. Sitting head turns x5 2  14.Sitting head nods x5 3  15. In stance, 180 turn to L    16. In stance, 180 turn to R     OTHOSTATICS: Supine: 136/74 Standing: 146/72;  HR 47   FUNCTIONAL GAIT:  to be assessed                                                                                                                             TREATMENT  DATE: 08-06-23   Gaze Adaptation:  x1 Viewing Horizontal: Position: seated, Time: 60 secs, Reps: 1, and Comment: vertical provoked > symptoms than horizontal and x1 Viewing Vertical:  Position: seated, Time: 60 secs, Reps: 1, and Comment: > symptoms with vertical than with horizontal  PATIENT EDUCATION: Education details: x1 viewing exercise for HEP - seated position;  walk as much as possible (RW recommended); increase fluid & food intake Person educated: Patient Education method: Explanation, Demonstration, and Handouts Education comprehension: verbalized understanding and returned demonstration  HOME EXERCISE PROGRAM:  GOALS: Goals reviewed with patient? Yes  SHORT TERM GOALS:  4 weeks = 08-31-23  Pt will perform x1 viewing exercise - both horizontal and vertical directions with c/o dizziness </= 5/10 upon completion. Baseline:  8/10 after vertical x1 viewing on 08-06-23 Goal status: INITIAL  2.  Complete DHI and set LTG as appropriate. Baseline:  Goal status: INITIAL  3.  Pt will subjectively report at least 25% improvement in dizziness. Baseline:  Goal status: INITIAL  4.  Amb. 115' with SPC on flat, even surface and perform intermittent horizontal head turns with c/o dizziness </= 6/10 with CGA. Baseline: unable to perform head turns during ambulation due to c/o dizziness Goal status: INITIAL  5.  Independent in HEP for vestibular exercises. Baseline:  Goal status: INITIAL   LONG TERM GOALS: Target date:  8 weeks =  09-28-23  Pt will perform x1 viewing exercise - both horizontal and vertical  directions for 60 secs with c/o dizziness </= 3/10 upon completion of exercise. Baseline: 8/10 on 08-06-23 Goal status: INITIAL  2.  Improve DHI score by at least 20 points to improve dizziness for improved quality of life. Baseline:  TBA  Goal status: INITIAL  3.  Subjectively report at least 50% improvement in dizziness. Baseline:  Goal status: INITIAL  4.  Modified independent ambulation without device - household and short community distance (</= 300') Baseline: pt using SPC for assistance with amb.  Goal status: INITIAL  5.  Maintain balance on mCTSIB conditions 3 and 4 for at least 30 secs without LOB to demo improved vestibular input in maintaining balance. Baseline: pt stood on floor with EC for 30 secs with mild postural sway with c/o dizziness Goal status: INITIAL  6.  Independent in updated HEP for vestibular exercises. Baseline:  Goal status: INITIAL  ASSESSMENT:  CLINICAL IMPRESSION: Patient is a 68 y.o. lady who was seen today for physical therapy evaluation and treatment for dizziness (referral states Rt BPPV, however symptoms noted in today's eval are not fully consistent with Rt BPPV).  Pt reports dizziness is constant but varies in intensity.  No nystagmus was noted with any positional testing, however, pt took Meclizine approx. 6 hours prior to appt so symptoms/nystagmus may have been masked by this medication.  Pt reported moderate dizziness with vertical smooth pursuit testing and with x1 viewing with vertical head turns.  Requested pt to withhold Meclizine at least 24 hours prior to next scheduled PT appt for more accurate assessment of nystagmus with positional testing.  Pt agreed to do so.  Pt will benefit from PT to address dizziness and vestibular deficits.    OBJECTIVE IMPAIRMENTS: decreased balance, decreased mobility, difficulty walking, dizziness, and impaired sensation.   ACTIVITY LIMITATIONS: carrying, squatting, and locomotion level  PARTICIPATION  LIMITATIONS: cleaning, laundry, driving, shopping, and community activity  PERSONAL FACTORS: Transportation and 1 comorbidity: dizziness of unknown etiology/paresthesias Lt side of face  are also affecting patient's functional outcome.  REHAB POTENTIAL: Good  CLINICAL DECISION MAKING: Evolving/moderate complexity  EVALUATION COMPLEXITY: Moderate   PLAN:  PT FREQUENCY: 2x/week (2x/week recommended but pt states she may only be able to do 1 appt/week due to lack of transportation)  PT DURATION: 8 weeks  PLANNED INTERVENTIONS: 97110-Therapeutic exercises, 97530- Therapeutic activity, [id number]- Neuromuscular re-education, 97535- Self Care, [id number]- Gait training, and [phone]- Canalith repositioning  PLAN FOR NEXT SESSION: recheck Rt BPPV (pt to hold on taking Meclizine); check x1 viewing ex. For HEP - do DHI   Verla Bryngelson, Donavan Burnet, PT 08/06/2023, 12:08 PM

## 2023-08-06 NOTE — Telephone Encounter (Signed)
 Please let pt know that FMLA is best completed by PCP.

## 2023-08-06 NOTE — Telephone Encounter (Signed)
 I called patient to provide her results and she wanted to let me know that she might get fired from her job because no one wants to fill out her FMLA paperwork. Patient stated she went to physical therapy today and they told her they don't fill out the [facility] paperwork and she was informed our office does not do them either.   I informed patient that I do not see any mention of FMLA paperwork from her visit or past phone calls to Dr. Allena Katz. I did inform patient that our office does do FMLA but it is on a case to case basis and need to be approved by the physician. Patient stated that she feels like she is getting thrown all around and fears she will lose her job due to not being able to return back to work with her condition. I informed patient that I will send Dr. Allena Katz a message to get some clarification for patient about her FMLA. Patient is aware I will contact her back once I hear back from Dr. Allena Katz.

## 2023-08-07 ENCOUNTER — Telehealth: Payer: Self-pay | Admitting: Neurology

## 2023-08-07 NOTE — Telephone Encounter (Signed)
 Pt called in stating she went to PT and they told her to stop taking the meclizine so they could see her without the medicine. She says her face went back to how it was before. She started taking it again so she could still breathe and releaved the pain. She feels like she is "yo yoing" and cannot go back to PT without the medicine so she can breathe.

## 2023-08-08 NOTE — Telephone Encounter (Signed)
 Called patient and informed her that Meclizine is for dizziness, so stopping the medication does not effect her facial sensation or ability to breath.  She should follow-up with PCP for breathing complaints.    Patient verbalized understanding and no further questions or concerns.

## 2023-08-08 NOTE — Telephone Encounter (Signed)
 Called patient and informed her that FMLA is best completed by PCP. Patient stated "I got you" and had no further questions or concerns.

## 2023-08-08 NOTE — Telephone Encounter (Signed)
 Meclizine is for dizziness, so stopping the medication does not effect her facial sensation or ability to breath.  She should follow-up with PCP for breathing complaints.

## 2023-08-09 ENCOUNTER — Encounter: Payer: Self-pay | Admitting: Physical Therapy

## 2023-08-09 ENCOUNTER — Ambulatory Visit: Attending: Neurology | Admitting: Physical Therapy

## 2023-08-09 DIAGNOSIS — R42 Dizziness and giddiness: Secondary | ICD-10-CM | POA: Insufficient documentation

## 2023-08-09 DIAGNOSIS — H8112 Benign paroxysmal vertigo, left ear: Secondary | ICD-10-CM | POA: Diagnosis present

## 2023-08-09 DIAGNOSIS — R2681 Unsteadiness on feet: Secondary | ICD-10-CM | POA: Diagnosis present

## 2023-08-09 NOTE — Patient Instructions (Addendum)
     Rolling    With pillow under head, start on back. Roll slowly to right. Hold position until symptoms subside. Roll slowly onto left side. Hold position until symptoms subside. Repeat sequence __8-10__ times per session. Do _2-3___ sessions per day.  Copyright  [employer]. All rights reserved.

## 2023-08-09 NOTE — Therapy (Unsigned)
 OUTPATIENT PHYSICAL THERAPY VESTIBULAR TREATMENT NOTE     Patient Name: Brenda Douglas MRN: [id number] DOB:1955/07/19, 68 y.o., female Today's Date: 08/10/2023  END OF SESSION:  PT End of Session - 08/10/23 1109     Visit Number 2    Number of Visits 16    Date for PT Re-Evaluation 08/31/23    Authorization Type UHC    PT Start Time 0750    PT Stop Time 0840    PT Time Calculation (min) 50 min    Activity Tolerance Patient tolerated treatment well    Behavior During Therapy WFL for tasks assessed/performed              Past Medical History:  Diagnosis Date   Angio-edema    Urticaria    Past Surgical History:  Procedure Laterality Date   BACK SURGERY     Patient Active Problem List   Diagnosis Date Noted   Hypertension 02/02/2020   Urticaria 01/06/2020    PCP: Deatra James, MD REFERRING PROVIDER: Glendale Chard, DO  REFERRING DIAG: Diagnosis H81.11 (ICD-10-CM) - Benign paroxysmal positional vertigo of right ear  THERAPY DIAG:  BPPV (benign paroxysmal positional vertigo), left  Dizziness and giddiness  ONSET DATE: 07-14-23  Rationale for Evaluation and Treatment: Rehabilitation  SUBJECTIVE:   SUBJECTIVE STATEMENT:    Pt reports she tried not taking the Meclizine and says her face and lips started swelling up and she started having difficulty breathing so she took it; pt called neurologist and was told that this medication is for dizziness and should not have anything to do with her breathing, facial paresthias and swelling.  Pt reports she is going to her PCP's office after today's PT to get her FMLA paperwork completed. Pt states she took Meclizine last night around midnight.  Pt using SBQC for assistance with ambulation.   Pt reports the dizziness started about 3 weeks ago - pt reports she woke up with dizziness; had nausea and vomiting with it so she quit drinking; says she has increased her fluid intake but it is not back to normal at this time; pt  reports the dizziness is when she is up and walking - but still feels it in sitting; states best position is lying down  Pt accompanied by: self  PERTINENT HISTORY: History of present illness: Starting in early March, she began having numbness involving the left forehead and cheek.  Symptoms are worse in the evening and feels as if it is swollen inside her mouth.  She has been to the ER three times for these symptoms and had MRI brain wwo contrast which does not show any intracranial pathology to explain her symptoms. She also complains of tingling over the lateral side of the right lower leg and thigh.  Also over the past 3 weeks, she reports having dizziness, nausea, and instability.  Dizziness is described as room-spinning and worse with head movement or positional changes.  She starting using a cane.  It took two clinical staff personnel to assist patient to the exam room today.   HTN, Angio-edema  PAIN:  Are you having pain? No  PRECAUTIONS: Fall and Other: dizziness (mostly constant)  RED FLAGS: None   WEIGHT BEARING RESTRICTIONS: No  FALLS: Has patient fallen in last 6 months? No   PLOF: Independent  PATIENT GOALS: resolve the vertigo  OBJECTIVE:  Note: Objective measures were completed at Evaluation unless otherwise noted.  DIAGNOSTIC FINDINGS: MRI brain wwo contrast 07/14/2023: 1. No acute intracranial  abnormality or significant interval change. 2. Minimal white matter changes in the right corona radiata are nonspecific but likely reflect the sequela of chronic microvascular ischemia. 3. No pathological enhancement  COGNITION: Overall cognitive status: Within functional limits for tasks assessed   SENSATION: Pt reports paresthesia Lt side of face  POSTURE:  No Significant postural limitations  Cervical ROM:  WNL's   BED MOBILITY:  Independent  TRANSFERS: Assistive device utilized: Single point cane  Sit to stand: Modified independence Stand to sit: Modified  independence   GAIT: Gait pattern: step through pattern Distance walked: 50' Assistive device utilized: Single point cane Level of assistance: CGA Comments: slow speed due to c/o dizziness  FUNCTIONAL TESTS:  See vestibular assessment  VESTIBULAR ASSESSMENT:  GENERAL OBSERVATION: Pt amb. Very slowly with [facility] - states the dizziness has gotten more intense since it started; was independent in all activities prior to onset on 07-14-23   SYMPTOM BEHAVIOR:  Subjective history: pt reports the left side of her face is swollen - unable to note edema by observation; had dental appt 2 weeks for removal of tooth (was possibly hitting a nerve)  Non-Vestibular symptoms: nausea/vomiting  Type of dizziness: Spinning/Vertigo, Unsteady with head/body turns, and "World moves"  Frequency: occurs daily - intensity varies  Duration: constant - varies in intensity  Aggravating factors: Induced by motion: occur when walking, sitting in a moving car, and activity in general  Relieving factors: lying supine  Progression of symptoms:  was severe when initially started - says it has eased off  OCULOMOTOR EXAM:  Ocular Alignment: normal  Ocular ROM: No Limitations  Spontaneous Nystagmus: absent  Gaze-Induced Nystagmus: absent  Smooth Pursuits: intact c/o dizziness with testing - >dizziness reported with vertical smooth pursuit than with horizontal smooth pursuit  Saccades: intact    FRENZEL - FIXATION SUPRESSED:  Ocular Alignment: normal  Spontaneous Nystagmus: absent  Gaze-Induced Nystagmus: absent   POSITIONAL TESTING: Right Dix-Hallpike: questionable suppressed nystagmus (pt took Meclizine at 3 AM this morning) - pt reported no increase in dizziness in this test position Left Dix-Hallpike: no nystagmus Right Roll Test: no nystagmus Left Roll Test: no nystagmus Right Sidelying: no nystagmus Left Sidelying: no nystagmus  MOTION SENSITIVITY:  Motion Sensitivity Quotient Intensity: 0 = none, 1 =  Lightheaded, 2 = Mild, 3 = Moderate, 4 = Severe, 5 = Vomiting  Intensity  1. Sitting to supine   2. Supine to L side   3. Supine to R side   4. Supine to sitting 0  5. L Hallpike-Dix 2  6. Up from L  0  7. R Hallpike-Dix 2  8. Up from R  0  9. Sitting, head tipped to L knee   10. Head up from L knee   11. Sitting, head tipped to R knee   12. Head up from R knee   13. Sitting head turns x5 2  14.Sitting head nods x5 3  15. In stance, 180 turn to L    16. In stance, 180 turn to R     OTHOSTATICS: Supine: 136/74 Standing: 146/72;  HR 47   FUNCTIONAL GAIT:  to be assessed  TREATMENT DATE: 08-09-23  Self Care:  discussed pt's effects with not taking Meclizine - pressure on Lt side of nasal cavity, difficulty breathing, lips and inside of mouth swelling Reviewed gaze stabilization exercise - pt reports no increase in symptoms reported with this exercise  NeuroRe-ed:  Lt Dix-Hallpike - no rotary nystagmus noted but slightly horizontal nystagmus (difficult to discern direction);                       Rt Dix-Hallpike test - no nystagmus noted                       Rt sidelying test - no change in dizziness reported in this position - no nystagmus noted                        Lt sidelying test - mild low intensity nystagmus noted but difficult to discern direction  Rt horizontal roll test - negative for nystagmus and c/o vertigo Lt horizontal roll test - Lt geotropic horizontal nystagmus low amplitude, long duration - pt reported mild dizziness in test position   Canalith Repositioning: 1 rep Epley maneuver for Lt BPPV - no rotary nystagmus noted and no c/o spinning vertigo in any position  Bar-b-que roll for Lt BPPV horizontal canalithiasis - 3 reps -- slight improvement in dizziness reported on 3rd rep   HEP - see pt instructions - rolling  PATIENT  EDUCATION: Education details:  instructed in repetitive rolling for habituation/treatment of Lt horizontal canalithiasis Person educated: Patient Education method: Explanation, Demonstration, and Handouts Education comprehension: verbalized understanding and returned demonstration  HOME EXERCISE PROGRAM:  GOALS: Goals reviewed with patient? Yes  SHORT TERM GOALS:  4 weeks = 08-31-23  Pt will perform x1 viewing exercise - both horizontal and vertical directions with c/o dizziness </= 5/10 upon completion. Baseline:  8/10 after vertical x1 viewing on 08-06-23 Goal status: INITIAL  2.  Complete DHI and set LTG as appropriate. Baseline:  Goal status: INITIAL  3.  Pt will subjectively report at least 25% improvement in dizziness. Baseline:  Goal status: INITIAL  4.  Amb. 115' with SPC on flat, even surface and perform intermittent horizontal head turns with c/o dizziness </= 6/10 with CGA. Baseline: unable to perform head turns during ambulation due to c/o dizziness Goal status: INITIAL  5.  Independent in HEP for vestibular exercises. Baseline:  Goal status: INITIAL   LONG TERM GOALS: Target date:  8 weeks =  09-28-23  Pt will perform x1 viewing exercise - both horizontal and vertical directions for 60 secs with c/o dizziness </= 3/10 upon completion of exercise. Baseline: 8/10 on 08-06-23 Goal status: INITIAL  2.  Improve DHI score by at least 20 points to improve dizziness for improved quality of life. Baseline:  TBA  Goal status: INITIAL  3.  Subjectively report at least 50% improvement in dizziness. Baseline:  Goal status: INITIAL  4.  Modified independent ambulation without device - household and short community distance (</= 300') Baseline: pt using SPC for assistance with amb.  Goal status: INITIAL  5.  Maintain balance on mCTSIB conditions 3 and 4 for at least 30 secs without LOB to demo improved vestibular input in maintaining balance. Baseline: pt stood on floor  with EC for 30 secs with mild postural sway with c/o dizziness Goal status: INITIAL  6.  Independent in updated HEP for vestibular exercises. Baseline:  Goal status:  INITIAL  ASSESSMENT:  CLINICAL IMPRESSION: PT session focused on treatment for Lt horizontal canalithiasis as horizontal nystagmus was noted in Lt sidelying and Lt horizontal roll test.  Nystagmus was low intensity and small amplitude but long duration, however, pt did not report moderate - severe dizziness in test position;  symptoms possibly suppressed by Meclizine which pt reports she took approx. 8 hours prior to this PT appt.  Symptoms do indicate Lt horizontal canal BPPV and pt reported improvement in dizziness on 3rd rep of Bar-B-Que roll for treatment.  Will continue to assess and treat prn.   OBJECTIVE IMPAIRMENTS: decreased balance, decreased mobility, difficulty walking, dizziness, and impaired sensation.   ACTIVITY LIMITATIONS: carrying, squatting, and locomotion level  PARTICIPATION LIMITATIONS: cleaning, laundry, driving, shopping, and community activity  PERSONAL FACTORS: Transportation and 1 comorbidity: dizziness of unknown etiology/paresthesias Lt side of face  are also affecting patient's functional outcome.   REHAB POTENTIAL: Good  CLINICAL DECISION MAKING: Evolving/moderate complexity  EVALUATION COMPLEXITY: Moderate   PLAN:  PT FREQUENCY: 2x/week (2x/week recommended but pt states she may only be able to do 1 appt/week due to lack of transportation)  PT DURATION: 8 weeks  PLANNED INTERVENTIONS: 97110-Therapeutic exercises, 97530- Therapeutic activity, [id number]- Neuromuscular re-education, [phone]- Self Care, [id number]- Gait training, and [phone]- Canalith repositioning  PLAN FOR NEXT SESSION: reassess Lt BPPV - horizontal canal   Rakia Frayne, Donavan Burnet, PT 08/10/2023, 11:12 AM

## 2023-08-10 ENCOUNTER — Encounter: Payer: Self-pay | Admitting: Physical Therapy

## 2023-08-16 ENCOUNTER — Ambulatory Visit: Payer: Self-pay | Admitting: Physical Therapy

## 2023-08-16 VITALS — BP 120/78

## 2023-08-16 DIAGNOSIS — R42 Dizziness and giddiness: Secondary | ICD-10-CM

## 2023-08-16 DIAGNOSIS — R2681 Unsteadiness on feet: Secondary | ICD-10-CM

## 2023-08-16 DIAGNOSIS — H8112 Benign paroxysmal vertigo, left ear: Secondary | ICD-10-CM

## 2023-08-16 NOTE — Therapy (Unsigned)
 OUTPATIENT PHYSICAL THERAPY VESTIBULAR TREATMENT NOTE     Patient Name: Brenda Douglas MRN: [id number] DOB:Mar 25, 1956, 68 y.o., female Today's Date: 08/17/2023  END OF SESSION:  PT End of Session - 08/17/23 1520     Visit Number 3    Number of Visits 16    Date for PT Re-Evaluation 08/31/23    Authorization Type UHC    PT Start Time 0931    PT Stop Time 1016    PT Time Calculation (min) 45 min    Activity Tolerance Patient tolerated treatment well    Behavior During Therapy [facility] for tasks assessed/performed               Past Medical History:  Diagnosis Date   Angio-edema    Urticaria    Past Surgical History:  Procedure Laterality Date   BACK SURGERY     Patient Active Problem List   Diagnosis Date Noted   Hypertension 02/02/2020   Urticaria 01/06/2020    PCP: Deatra James, MD REFERRING PROVIDER: Glendale Chard, DO  REFERRING DIAG: Diagnosis H81.11 (ICD-10-CM) - Benign paroxysmal positional vertigo of right ear  THERAPY DIAG:  BPPV (benign paroxysmal positional vertigo), left  Dizziness and giddiness  Unsteadiness on feet  ONSET DATE: 07-14-23  Rationale for Evaluation and Treatment: Rehabilitation  SUBJECTIVE:   SUBJECTIVE STATEMENT:    Pt reports she is doing some better - rates dizziness 6/10 but is no longer spinning.  Pt had appt with ENT last week - notes state that he noted "lateral nystagmus" and has referred pt to [facility] for further testing.  Pt states appt is not until early June.  Pt reports she has an appt with her PCP, Dr. Wynelle Link, on 4-28; wants to cancel the 2 scheduled PT appts until after she sees Dr. Wynelle Link  Pt reports the dizziness started about 3 weeks ago - pt reports she woke up with dizziness; had nausea and vomiting with it so she quit drinking; says she has increased her fluid intake but it is not back to normal at this time; pt reports the dizziness is when she is up and walking - but still feels it in sitting; states best  position is lying down  Pt accompanied by: self  PERTINENT HISTORY: History of present illness: Starting in early March, she began having numbness involving the left forehead and cheek.  Symptoms are worse in the evening and feels as if it is swollen inside her mouth.  She has been to the ER three times for these symptoms and had MRI brain wwo contrast which does not show any intracranial pathology to explain her symptoms. She also complains of tingling over the lateral side of the right lower leg and thigh.  Also over the past 3 weeks, she reports having dizziness, nausea, and instability.  Dizziness is described as room-spinning and worse with head movement or positional changes.  She starting using a cane.  It took two clinical staff personnel to assist patient to the exam room today.   HTN, Angio-edema  PAIN:  Are you having pain? No  PRECAUTIONS: Fall and Other: dizziness (mostly constant)  RED FLAGS: None   WEIGHT BEARING RESTRICTIONS: No  FALLS: Has patient fallen in last 6 months? No   PLOF: Independent  PATIENT GOALS: resolve the vertigo  OBJECTIVE:  Note: Objective measures were completed at Evaluation unless otherwise noted.  DIAGNOSTIC FINDINGS: MRI brain wwo contrast 07/14/2023: 1. No acute intracranial abnormality or significant interval change. 2. Minimal  white matter changes in the right corona radiata are nonspecific but likely reflect the sequela of chronic microvascular ischemia. 3. No pathological enhancement  COGNITION: Overall cognitive status: Within functional limits for tasks assessed   SENSATION: Pt reports paresthesia Lt side of face  POSTURE:  No Significant postural limitations  Cervical ROM:  WNL's   BED MOBILITY:  Independent  TRANSFERS: Assistive device utilized: Single point cane  Sit to stand: Modified independence Stand to sit: Modified independence   GAIT: Gait pattern: step through pattern Distance walked: 50' Assistive  device utilized: Single point cane Level of assistance: CGA Comments: slow speed due to c/o dizziness  FUNCTIONAL TESTS:  See vestibular assessment  VESTIBULAR ASSESSMENT:  GENERAL OBSERVATION: Pt amb. Very slowly with [facility] - states the dizziness has gotten more intense since it started; was independent in all activities prior to onset on 07-14-23   SYMPTOM BEHAVIOR:  Subjective history: pt reports the left side of her face is swollen - unable to note edema by observation; had dental appt 2 weeks for removal of tooth (was possibly hitting a nerve)  Non-Vestibular symptoms: nausea/vomiting  Type of dizziness: Spinning/Vertigo, Unsteady with head/body turns, and "World moves"  Frequency: occurs daily - intensity varies  Duration: constant - varies in intensity  Aggravating factors: Induced by motion: occur when walking, sitting in a moving car, and activity in general  Relieving factors: lying supine  Progression of symptoms:  was severe when initially started - says it has eased off  OCULOMOTOR EXAM:  Ocular Alignment: normal  Ocular ROM: No Limitations  Spontaneous Nystagmus: absent  Gaze-Induced Nystagmus: absent  Smooth Pursuits: intact c/o dizziness with testing - >dizziness reported with vertical smooth pursuit than with horizontal smooth pursuit  Saccades: intact    FRENZEL - FIXATION SUPRESSED:  Ocular Alignment: normal  Spontaneous Nystagmus: absent  Gaze-Induced Nystagmus: absent   POSITIONAL TESTING: Right Dix-Hallpike: questionable suppressed nystagmus (pt took Meclizine at 3 AM this morning) - pt reported no increase in dizziness in this test position Left Dix-Hallpike: no nystagmus Right Roll Test: no nystagmus Left Roll Test: no nystagmus Right Sidelying: no nystagmus Left Sidelying: no nystagmus  MOTION SENSITIVITY:  Motion Sensitivity Quotient Intensity: 0 = none, 1 = Lightheaded, 2 = Mild, 3 = Moderate, 4 = Severe, 5 = Vomiting  Intensity  1. Sitting to  supine   2. Supine to L side   3. Supine to R side   4. Supine to sitting 0  5. L Hallpike-Dix 2  6. Up from L  0  7. R Hallpike-Dix 2  8. Up from R  0  9. Sitting, head tipped to L knee   10. Head up from L knee   11. Sitting, head tipped to R knee   12. Head up from R knee   13. Sitting head turns x5 2  14.Sitting head nods x5 3  15. In stance, 180 turn to L    16. In stance, 180 turn to R     OTHOSTATICS: Supine: 136/74 Standing: 146/72;  HR 47   FUNCTIONAL GAIT:  to be assessed  TREATMENT DATE: 08-16-23  Self Care:  discussed pt's progress and HEP; pt reports she has not taken the Meclizine since ENT instructed her to discontinue this medication; pt reports Lt side of face is not swelling as much as it was  NeuroRe-ed:  Lt Dix-Hallpike - no rotary nystagmus noted                        Rt Dix-Hallpike test - no nystagmus noted                       Rt sidelying test - no change in dizziness reported in this position - no nystagmus noted - c/o dizziness with return to upright sitting                       Lt sidelying test - no nystagmus noted; pt reported dizziness with return to upright sitting  Rt horizontal roll test - negative for nystagmus and c/o vertigo Lt horizontal roll test - very low intensity and very mild Lt geotropic horizontal nystagmus -  low amplitude and short duration - pt reported mild dizziness in test position   Pt amb. With horizontal head turns 40' x 2 reps with mild postural instability and deviation in path; pt reported dizziness with amb. With head turns- no device used - CGA for safety  Canalith Repositioning: Bar-b-que roll for Lt BPPV horizontal canalithiasis - 3 reps -- - no nystagmus noted on reps 2 and 3 with pt reporting very mild dizziness with return to sitting - subsides quickly - within 3-5 secs     PATIENT  EDUCATION: Education details:  instructed in repetitive rolling for habituation/treatment of Lt horizontal canalithiasis Person educated: Patient Education method: Explanation, Demonstration, and Handouts Education comprehension: verbalized understanding and returned demonstration  HOME EXERCISE PROGRAM:  GOALS: Goals reviewed with patient? Yes  SHORT TERM GOALS:  4 weeks = 08-31-23  Pt will perform x1 viewing exercise - both horizontal and vertical directions with c/o dizziness </= 5/10 upon completion. Baseline:  8/10 after vertical x1 viewing on 08-06-23 Goal status: INITIAL  2.  Complete DHI and set LTG as appropriate. Baseline:  Goal status: INITIAL  3.  Pt will subjectively report at least 25% improvement in dizziness. Baseline:  Goal status: INITIAL  4.  Amb. 115' with SPC on flat, even surface and perform intermittent horizontal head turns with c/o dizziness </= 6/10 with CGA. Baseline: unable to perform head turns during ambulation due to c/o dizziness Goal status: INITIAL  5.  Independent in HEP for vestibular exercises. Baseline:  Goal status: INITIAL   LONG TERM GOALS: Target date:  8 weeks =  09-28-23  Pt will perform x1 viewing exercise - both horizontal and vertical directions for 60 secs with c/o dizziness </= 3/10 upon completion of exercise. Baseline: 8/10 on 08-06-23 Goal status: INITIAL  2.  Improve DHI score by at least 20 points to improve dizziness for improved quality of life. Baseline:  TBA  Goal status: INITIAL  3.  Subjectively report at least 50% improvement in dizziness. Baseline:  Goal status: INITIAL  4.  Modified independent ambulation without device - household and short community distance (</= 300') Baseline: pt using SPC for assistance with amb.  Goal status: INITIAL  5.  Maintain balance on mCTSIB conditions 3 and 4 for at least 30 secs without LOB to demo improved vestibular input in maintaining balance. Baseline: pt stood  on floor  with EC for 30 secs with mild postural sway with c/o dizziness Goal status: INITIAL  6.  Independent in updated HEP for vestibular exercises. Baseline:  Goal status: INITIAL  ASSESSMENT:  CLINICAL IMPRESSION: PT session focused on treatment for Lt horizontal canalithiasis as very low intensity and short duration horizontal nystagmus was noted in Lt horizontal roll test.  3 reps of Bar-B-que roll completed for treatment with no nystagmus noted on reps 2 and 3.  Pt has difficulty communicating/describing symptoms and change in dizziness with positional testing.  Pt has unsteadiness with ambulating with horizontal head turns, indicative of decreased vestibular input in maintaining balance.  Continued participation in PT is recommended with additional 2 PT appts scheduled, however, pt requests to cancel these appts and be placed on HOLD until after she sees her PCP, Dr. Wynelle Link, on 09-03-23.    OBJECTIVE IMPAIRMENTS: decreased balance, decreased mobility, difficulty walking, dizziness, and impaired sensation.   ACTIVITY LIMITATIONS: carrying, squatting, and locomotion level  PARTICIPATION LIMITATIONS: cleaning, laundry, driving, shopping, and community activity  PERSONAL FACTORS: Transportation and 1 comorbidity: dizziness of unknown etiology/paresthesias Lt side of face  are also affecting patient's functional outcome.   REHAB POTENTIAL: Good  CLINICAL DECISION MAKING: Evolving/moderate complexity  EVALUATION COMPLEXITY: Moderate   PLAN:  PT FREQUENCY: 2x/week (2x/week recommended but pt states she may only be able to do 1 appt/week due to lack of transportation)  PT DURATION: 8 weeks  PLANNED INTERVENTIONS: 97110-Therapeutic exercises, 97530- Therapeutic activity, [id number]- Neuromuscular re-education, [phone]- Self Care, 10272- Gait training, and [phone]- Canalith repositioning  PLAN FOR NEXT SESSION: reassess Lt BPPV - horizontal canal   Jackelynn Hosie, Donavan Burnet, PT 08/17/2023, 3:22 PM

## 2023-08-17 ENCOUNTER — Encounter: Payer: Self-pay | Admitting: Physical Therapy

## 2023-08-20 ENCOUNTER — Ambulatory Visit: Admitting: Neurology

## 2023-08-23 ENCOUNTER — Ambulatory Visit: Payer: Self-pay | Admitting: Physical Therapy

## 2023-08-28 ENCOUNTER — Ambulatory Visit: Payer: Self-pay | Admitting: Physical Therapy

## 2023-09-06 ENCOUNTER — Encounter: Payer: Self-pay | Admitting: Physical Therapy

## 2023-09-06 ENCOUNTER — Ambulatory Visit: Payer: Self-pay | Attending: Neurology | Admitting: Physical Therapy

## 2023-09-06 DIAGNOSIS — R42 Dizziness and giddiness: Secondary | ICD-10-CM | POA: Diagnosis present

## 2023-09-06 DIAGNOSIS — R2681 Unsteadiness on feet: Secondary | ICD-10-CM | POA: Diagnosis present

## 2023-09-06 NOTE — Therapy (Signed)
 OUTPATIENT PHYSICAL THERAPY VESTIBULAR TREATMENT NOTE     Patient Name: Brenda Douglas MRN: [id number] DOB:Sep 26, 1955, 68 y.o., female Today's Date: 09/06/2023  END OF SESSION:  PT End of Session - 09/06/23 0851     Visit Number 4    Number of Visits 16    Date for PT Re-Evaluation 08/31/23    Authorization Type [employer]    PT Start Time [phone]    PT Stop Time 0932    PT Time Calculation (min) 46 min    Equipment Utilized During Treatment Gait belt    Activity Tolerance Patient tolerated treatment well    Behavior During Therapy WFL for tasks assessed/performed               Past Medical History:  Diagnosis Date   Angio-edema    Urticaria    Past Surgical History:  Procedure Laterality Date   BACK SURGERY     Patient Active Problem List   Diagnosis Date Noted   Hypertension 02/02/2020   Urticaria 01/06/2020    PCP: Sun, Vyvyan, MD REFERRING PROVIDER: Patel, Donika K, DO  REFERRING DIAG: Diagnosis H81.11 (ICD-10-CM) - Benign paroxysmal positional vertigo of right ear  THERAPY DIAG:  Dizziness and giddiness  Unsteadiness on feet  ONSET DATE: 07-14-23  Rationale for Evaluation and Treatment: Rehabilitation  SUBJECTIVE:   SUBJECTIVE STATEMENT:    Pt reports dizziness is better but still has some; has more imbalance at this time.  Pt rates intensity of dizziness 4-5/10; states the swelling in her mouth has gone down and now the dizziness is better; plans to try to return to work on May 14, but wants to work on balance so that she is able to do her work activities. Pt continues to use [facility] for assistance with ambulation and reports she also uses it in the house at times, depending on how she is feeling.  Initial PT Eval:  Pt reports the dizziness started about 3 weeks ago - pt reports she woke up with dizziness; had nausea and vomiting with it so she quit drinking; says she has increased her fluid intake but it is not back to normal at this time; pt reports the  dizziness is when she is up and walking - but still feels it in sitting; states best position is lying down  Pt accompanied by: self  PERTINENT HISTORY: History of present illness: Starting in early March, she began having numbness involving the left forehead and cheek.  Symptoms are worse in the evening and feels as if it is swollen inside her mouth.  She has been to the ER three times for these symptoms and had MRI brain wwo contrast which does not show any intracranial pathology to explain her symptoms. She also complains of tingling over the lateral side of the right lower leg and thigh.  Also over the past 3 weeks, she reports having dizziness, nausea, and instability.  Dizziness is described as room-spinning and worse with head movement or positional changes.  She starting using a cane.  It took two clinical staff personnel to assist patient to the exam room today.   HTN, Angio-edema  PAIN:  Are you having pain? Pt rates pain 4/10 on Lt side of face  PRECAUTIONS: Fall and Other: dizziness (mostly constant)  RED FLAGS: None   WEIGHT BEARING RESTRICTIONS: No  FALLS: Has patient fallen in last 6 months? No   PLOF: Independent  PATIENT GOALS: resolve the vertigo  OBJECTIVE:  Note: Objective measures were  completed at Evaluation unless otherwise noted.  DIAGNOSTIC FINDINGS: MRI brain wwo contrast 07/14/2023: 1. No acute intracranial abnormality or significant interval change. 2. Minimal white matter changes in the right corona radiata are nonspecific but likely reflect the sequela of chronic microvascular ischemia. 3. No pathological enhancement  COGNITION: Overall cognitive status: Within functional limits for tasks assessed   SENSATION: Pt reports paresthesia Lt side of face  POSTURE:  No Significant postural limitations  Cervical ROM:  WNL's   BED MOBILITY:  Independent  TRANSFERS: Assistive device utilized: Single point cane  Sit to stand: Modified  independence Stand to sit: Modified independence   GAIT: Gait pattern: step through pattern Distance walked: 50' Assistive device utilized: Single point cane Level of assistance: CGA Comments: slow speed due to c/o dizziness  FUNCTIONAL TESTS:  See vestibular assessment  VESTIBULAR ASSESSMENT:  GENERAL OBSERVATION: Pt amb. Very slowly with [facility] - states the dizziness has gotten more intense since it started; was independent in all activities prior to onset on 07-14-23   SYMPTOM BEHAVIOR:  Subjective history: pt reports the left side of her face is swollen - unable to note edema by observation; had dental appt 2 weeks for removal of tooth (was possibly hitting a nerve)  Non-Vestibular symptoms: nausea/vomiting  Type of dizziness: Spinning/Vertigo, Unsteady with head/body turns, and "World moves"  Frequency: occurs daily - intensity varies  Duration: constant - varies in intensity  Aggravating factors: Induced by motion: occur when walking, sitting in a moving car, and activity in general  Relieving factors: lying supine  Progression of symptoms:  was severe when initially started - says it has eased off  OCULOMOTOR EXAM:  Ocular Alignment: normal  Ocular ROM: No Limitations  Spontaneous Nystagmus: absent  Gaze-Induced Nystagmus: absent  Smooth Pursuits: intact c/o dizziness with testing - >dizziness reported with vertical smooth pursuit than with horizontal smooth pursuit  Saccades: intact    FRENZEL - FIXATION SUPRESSED:  Ocular Alignment: normal  Spontaneous Nystagmus: absent  Gaze-Induced Nystagmus: absent   POSITIONAL TESTING: Right Dix-Hallpike: questionable suppressed nystagmus (pt took Meclizine  at 3 AM this morning) - pt reported no increase in dizziness in this test position Left Dix-Hallpike: no nystagmus Right Roll Test: no nystagmus Left Roll Test: no nystagmus Right Sidelying: no nystagmus Left Sidelying: no nystagmus  MOTION SENSITIVITY:  Motion Sensitivity  Quotient Intensity: 0 = none, 1 = Lightheaded, 2 = Mild, 3 = Moderate, 4 = Severe, 5 = Vomiting  Intensity  1. Sitting to supine   2. Supine to L side   3. Supine to R side   4. Supine to sitting 0  5. L Hallpike-Dix 2  6. Up from L  0  7. R Hallpike-Dix 2  8. Up from R  0  9. Sitting, head tipped to L knee   10. Head up from L knee   11. Sitting, head tipped to R knee   12. Head up from R knee   13. Sitting head turns x5 2  14.Sitting head nods x5 3  15. In stance, 180 turn to L    16. 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  Positional vertigo appears to be resolved but pt has decreased vestibular input in maintaining balance as evidenced by unsteadiness with ambulation with head turns and moderate postural sway with standing on compliant surface with EC  Gait:  Functional gait assessment  [facility] PT Assessment - 09/06/23 0001       Functional Gait  Assessment   Gait assessed  Yes    Gait Level Surface Walks 20 ft, slow speed, abnormal gait pattern, evidence for imbalance or deviates 10-15 in outside of the 12 in walkway width. Requires more than 7 sec to ambulate 20 ft.   7.32 secs without SPC   Change in Gait Speed Able to change speed, demonstrates mild gait deviations, deviates 6-10 in outside of the 12 in walkway width, or no gait deviations, unable to achieve a major change in velocity, or uses a change in velocity, or uses an assistive device.    Gait with Horizontal Head Turns Performs head turns smoothly with slight change in gait  velocity (eg, minor disruption to smooth gait path), deviates 6-10 in outside 12 in walkway width, or uses an assistive device.    Gait with Vertical Head Turns Performs task with slight change in gait velocity (eg, minor disruption to smooth gait path), deviates 6 - 10 in outside 12 in walkway width or uses assistive device    Gait and Pivot Turn Pivot turns safely within 3 sec and stops quickly with no loss of balance.    Step Over Obstacle Is able to step over one shoe box (4.5 in total height) without changing gait speed. No evidence of imbalance.    Gait with Narrow Base of Support Is able to ambulate for 10 steps heel to toe with no staggering.    Gait with Eyes Closed Walks 20 ft, uses assistive device, slower speed, mild gait deviations, deviates 6-10 in outside 12 in walkway width. Ambulates 20 ft in less than 9 sec but greater than 7 sec.    Ambulating Backwards Walks 20 ft, no assistive devices, good speed, no evidence for imbalance, normal gait    Steps Alternating feet, must use rail.    Total Score 22                  NeuroRe-ed: TUG score:  14.59 secs without use of SPC  Gaze stabilization exercise - 30 secs horizontal head turns with c/o dizziness 5-6/10:  30 secs vertical head turns with c/o dizziness 5-6/10   SVA - line 10:  DVA line 7 with mild c/o dizziness provoked with testing:  abnormal as 3 line difference between SVA and DVA  mCTSIB:  Condition 1:  30 secs:  Condition 2: 30 secs:  Condition 3:  30 secs:  Condition 4:  30 secs with min. to mod. Postural sway but no LOB  Balance on [employer] - feet apart - EO and EC - issued these exercises for HEP; feet apart and progressing to feet together as able  Access Code: [email or web address] URL: [email or web address] Date: 09/06/2023 Prepared by: Johnnette Nakayama  Exercises - HEP updated - Standing Balance with Eyes OPEN, then CLOSED on Foam  - 1 x daily - 7 x weekly - 1 sets - 10 reps   PATIENT EDUCATION: Education  details:  balance on foam - EO and EC; gave pt another copy of gaze stabilization exercise Person educated: Patient Education method: Explanation, Demonstration, and Handouts Education comprehension: verbalized understanding and returned demonstration  HOME EXERCISE PROGRAM:  GOALS: Goals reviewed with patient? Yes  SHORT TERM GOALS:  4 weeks = 08-31-23  Pt will perform x1 viewing exercise - both horizontal and vertical directions with c/o dizziness </= 5/10 upon completion. Baseline:  8/10 after vertical x1 viewing on 08-06-23;  pt performed x1 viewing exercise for approx. 30 secs with c/o dizziness 5-6/10 intensity Goal status: Partially met 09-06-23  2.  Complete DHI and set LTG as appropriate. Baseline:  Goal status: Deferred - only 2 sessions attended prior to today's appt - 09-06-23  3.  Pt will subjectively report at least 25% improvement in dizziness. Baseline:  Goal status: Goal met 09-06-23  4.  Amb. 115' with SPC on flat, even surface and perform intermittent horizontal head turns with c/o dizziness </= 6/10 with CGA. Baseline: unable to perform head turns during ambulation due to c/o dizziness Goal status: Goal met 09-06-23  5.  Independent in HEP for vestibular exercises. Baseline:  Goal status: Goal met 09-06-23   LONG TERM GOALS: Target date:  8 weeks =  09-28-23  Pt will perform x1 viewing exercise - both horizontal and vertical directions for 60 secs with c/o dizziness </= 3/10 upon completion of exercise. Baseline: 8/10 on 08-06-23 Goal status: INITIAL  2.  Improve DHI score by at least 20 points to improve dizziness for improved quality of life. Baseline:  TBA  Goal status: INITIAL  3.  Subjectively report at least 50% improvement in dizziness. Baseline:  Goal status: INITIAL  4.  Modified independent ambulation without device - household and short community distance (</= 300') Baseline: pt using SPC for assistance with amb.  Goal status: INITIAL  5.  Maintain  balance on mCTSIB conditions 3 and 4 for at least 30 secs without LOB to demo improved vestibular input in maintaining balance. Baseline: pt stood on floor with EC for 30 secs with mild postural sway with c/o dizziness Goal status: INITIAL  6.  Independent in updated HEP for vestibular exercises. Baseline:  Goal status: INITIAL  ASSESSMENT:  CLINICAL IMPRESSION: Pt returns to PT after being placed on hold per her request until she had appt with her PCP on Monday, 4-28 this week.  Pt reports the dizziness is improved but not fully resolved but now has imbalance.  Pt has met STG's #3-5;  STG #1 partially met as pt able to perform x1 viewing exercise for 30 secs with dizziness 5-6/10 intensity.  STG #2 deferred as DHI was not assessed.  Pt's FGA score (assessed in today's session = 22/30 (indicative of fall risk);  pt had slightly abnormal DVA with a 3 line difference from SVA, indicative of impaired VOR.  Cont with POC.   OBJECTIVE IMPAIRMENTS: decreased balance, decreased mobility, difficulty walking, dizziness, and impaired sensation.   ACTIVITY LIMITATIONS: carrying, squatting, and locomotion level  PARTICIPATION LIMITATIONS: cleaning, laundry, driving, shopping, and community activity  PERSONAL FACTORS: Transportation and 1 comorbidity: dizziness of unknown etiology/paresthesias Lt side of face  are also affecting patient's functional outcome.   REHAB POTENTIAL: Good  CLINICAL DECISION MAKING: Evolving/moderate complexity  EVALUATION COMPLEXITY: Moderate   PLAN:  PT FREQUENCY: 2x/week (2x/week recommended but pt states she may only be able to do 1 appt/week due to lack of transportation)  PT DURATION: 8 weeks  PLANNED INTERVENTIONS: 97110-Therapeutic exercises,  [id number]- Therapeutic activity, [id number]- Neuromuscular re-education, [phone]- Self Care, [id number]- Gait training, and [phone]- Canalith repositioning  PLAN FOR NEXT SESSION: dynamic gait and balance with increased vestibular input  incorporated   Kaya Klausing, Celeste Cola, PT 09/06/2023, 4:08 PM

## 2023-09-11 ENCOUNTER — Ambulatory Visit: Payer: Self-pay | Admitting: Physical Therapy

## 2023-09-11 DIAGNOSIS — R42 Dizziness and giddiness: Secondary | ICD-10-CM

## 2023-09-11 DIAGNOSIS — R2681 Unsteadiness on feet: Secondary | ICD-10-CM

## 2023-09-11 NOTE — Therapy (Unsigned)
 OUTPATIENT PHYSICAL THERAPY VESTIBULAR TREATMENT NOTE/DISCHARGE SUMMARY     Patient Name: Brenda Douglas MRN: [id number] DOB:[birth year], 68 y.o., female Today's Date: 09/12/2023  END OF SESSION:  PT End of Session - 09/12/23 1309     Visit Number 5    Number of Visits 16    Date for PT Re-Evaluation 08/31/23    Authorization Type [employer]    PT Start Time 0759    PT Stop Time 0845    PT Time Calculation (min) 46 min    Equipment Utilized During Treatment Gait belt    Activity Tolerance Patient tolerated treatment well    Behavior During Therapy WFL for tasks assessed/performed                Past Medical History:  Diagnosis Date   Angio-edema    Urticaria    Past Surgical History:  Procedure Laterality Date   BACK SURGERY     Patient Active Problem List   Diagnosis Date Noted   Hypertension 02/02/2020   Urticaria 01/06/2020    PCP: Sun, Vyvyan, MD REFERRING PROVIDER: Patel, Donika K, DO  REFERRING DIAG: Diagnosis H81.11 (ICD-10-CM) - Benign paroxysmal positional vertigo of right ear  THERAPY DIAG:  Dizziness and giddiness  Unsteadiness on feet  ONSET DATE: 07-14-23  Rationale for Evaluation and Treatment: Rehabilitation  SUBJECTIVE:   SUBJECTIVE STATEMENT:    Pt reports dizziness continues to improve; plans to go back to work next week (09-19-23); rates dizziness mild at this time.  Pt reports she is ready for discharge; will continue to do the exercises at home   Initial PT Eval:  Pt reports the dizziness started about 3 weeks ago - pt reports she woke up with dizziness; had nausea and vomiting with it so she quit drinking; says she has increased her fluid intake but it is not back to normal at this time; pt reports the dizziness is when she is up and walking - but still feels it in sitting; states best position is lying down  Pt accompanied by: self  PERTINENT HISTORY: History of present illness: Starting in early March, she began having numbness  involving the left forehead and cheek.  Symptoms are worse in the evening and feels as if it is swollen inside her mouth.  She has been to the ER three times for these symptoms and had MRI brain wwo contrast which does not show any intracranial pathology to explain her symptoms. She also complains of tingling over the lateral side of the right lower leg and thigh.  Also over the past 3 weeks, she reports having dizziness, nausea, and instability.  Dizziness is described as room-spinning and worse with head movement or positional changes.  She starting using a cane.  It took two clinical staff personnel to assist patient to the exam room today.   HTN, Angio-edema  PAIN:  Are you having pain? Pt rates pain 4/10 on Lt side of face  PRECAUTIONS: Fall and Other: dizziness (mostly constant)  RED FLAGS: None   WEIGHT BEARING RESTRICTIONS: No  FALLS: Has patient fallen in last 6 months? No   PLOF: Independent  PATIENT GOALS: resolve the vertigo  OBJECTIVE:  Note: Objective measures were completed at Evaluation unless otherwise noted.  DIAGNOSTIC FINDINGS: MRI brain wwo contrast 07/14/2023: 1. No acute intracranial abnormality or significant interval change. 2. Minimal white matter changes in the right corona radiata are nonspecific but likely reflect the sequela of chronic microvascular ischemia. 3. No pathological enhancement  COGNITION: Overall cognitive status: Within functional limits for tasks assessed   SENSATION: Pt reports paresthesia Lt side of face  POSTURE:  No Significant postural limitations  Cervical ROM:  WNL's   BED MOBILITY:  Independent  TRANSFERS: Assistive device utilized: Single point cane  Sit to stand: Modified independence Stand to sit: Modified independence   GAIT: Gait pattern: step through pattern Distance walked: 50' Assistive device utilized: Single point cane Level of assistance: CGA Comments: slow speed due to c/o dizziness  FUNCTIONAL  TESTS:  See vestibular assessment  VESTIBULAR ASSESSMENT:  GENERAL OBSERVATION: Pt amb. Very slowly with [facility] - states the dizziness has gotten more intense since it started; was independent in all activities prior to onset on 07-14-23   SYMPTOM BEHAVIOR:  Subjective history: pt reports the left side of her face is swollen - unable to note edema by observation; had dental appt 2 weeks for removal of tooth (was possibly hitting a nerve)  Non-Vestibular symptoms: nausea/vomiting  Type of dizziness: Spinning/Vertigo, Unsteady with head/body turns, and "World moves"  Frequency: occurs daily - intensity varies  Duration: constant - varies in intensity  Aggravating factors: Induced by motion: occur when walking, sitting in a moving car, and activity in general  Relieving factors: lying supine  Progression of symptoms:  was severe when initially started - says it has eased off  OCULOMOTOR EXAM:  Ocular Alignment: normal  Ocular ROM: No Limitations  Spontaneous Nystagmus: absent  Gaze-Induced Nystagmus: absent  Smooth Pursuits: intact c/o dizziness with testing - >dizziness reported with vertical smooth pursuit than with horizontal smooth pursuit  Saccades: intact    FRENZEL - FIXATION SUPRESSED:  Ocular Alignment: normal  Spontaneous Nystagmus: absent  Gaze-Induced Nystagmus: absent   POSITIONAL TESTING: Right Dix-Hallpike: questionable suppressed nystagmus (pt took Meclizine  at 3 AM this morning) - pt reported no increase in dizziness in this test position Left Dix-Hallpike: no nystagmus Right Roll Test: no nystagmus Left Roll Test: no nystagmus Right Sidelying: no nystagmus Left Sidelying: no nystagmus  MOTION SENSITIVITY:  Motion Sensitivity Quotient Intensity: 0 = none, 1 = Lightheaded, 2 = Mild, 3 = Moderate, 4 = Severe, 5 = Vomiting  Intensity  1. Sitting to supine   2. Supine to L side   3. Supine to R side   4. Supine to sitting 0  5. L Hallpike-Dix 2  6. Up from L  0   7. R Hallpike-Dix 2  8. Up from R  0  9. Sitting, head tipped to L knee   10. Head up from L knee   11. Sitting, head tipped to R knee   12. Head up from R knee   13. Sitting head turns x5 2   /2  14.Sitting head nods x5 3  /2  15. In stance, 180 turn to L    16. In stance, 180 turn to R     OTHOSTATICS: Supine: 136/74 Standing: 146/72;  HR 47   FUNCTIONAL GAIT:  to be assessed  TREATMENT DATE: 09-11-23  Self Care:  discussed LTG's and progress; discussed work activities with pt reporting she does some lifting   Gait:  pt continues to use [facility] for assistance with ambulation Gait trained 115' x 2 reps without device; horizontal head turns 76' with mild deviation in path   Tandem gait - pt performed 12 steps tandem Performed ambulation with fast/slow speeds without device - with CGA - pt had no LOB      NeuroRe-ed:  Gaze stabilization exercise - 60 secs horizontal head turns with c/o dizziness 2-3/10:  60 secs vertical head turns with c/o dizziness 2-3/10   mCTSIB:  Condition 1:  30 secs:  Condition 2: 30 secs:  Condition 3:  30 secs:  Condition 4:  30 secs with min. Postural sway but no LOB  Balance on [employer] - feet apart - EO and EC - issued these exercises for HEP; feet apart and progressing to feet together as able Pt performed habituation activity - stood on blue mat - bend down to lift 5# kettlebell and lifted up in diagonal pattern "X" reps each with head and eye movement toward kettlebell Pt then performed  bending down, lifting kettlebell with 180 degree turns 2 reps to Rt side, 2 reps to Lt side; then 2 reps with 360 degree turn to each side  Seated position - horizontal head turns 5 reps - dizziness rating 2/5;  vertical head turns seated position - dizziness rating 2/5  Access Code: [email or web address] URL: [email or web address] Date:  09/06/2023 Prepared by: Johnnette Nakayama  Exercises - HEP updated - Standing Balance with Eyes OPEN, then CLOSED on Foam  - 1 x daily - 7 x weekly - 1 sets - 10 reps   PATIENT EDUCATION: Education details:  balance on foam - EO and EC; gave pt another copy of gaze stabilization exercise Person educated: Patient Education method: Explanation, Demonstration, and Handouts Education comprehension: verbalized understanding and returned demonstration  HOME EXERCISE PROGRAM:  GOALS: Goals reviewed with patient? Yes  SHORT TERM GOALS:  4 weeks = 08-31-23  Pt will perform x1 viewing exercise - both horizontal and vertical directions with c/o dizziness </= 5/10 upon completion. Baseline:  8/10 after vertical x1 viewing on 08-06-23;  pt performed x1 viewing exercise for approx. 30 secs with c/o dizziness 5-6/10 intensity Goal status: Partially met 09-06-23  2.  Complete DHI and set LTG as appropriate. Baseline:  Goal status: Deferred - only 2 sessions attended prior to today's appt - 09-06-23  3.  Pt will subjectively report at least 25% improvement in dizziness. Baseline:  Goal status: Goal met 09-06-23  4.  Amb. 115' with SPC on flat, even surface and perform intermittent horizontal head turns with c/o dizziness </= 6/10 with CGA. Baseline: unable to perform head turns during ambulation due to c/o dizziness Goal status: Goal met 09-06-23  5.  Independent in HEP for vestibular exercises. Baseline:  Goal status: Goal met 09-06-23   LONG TERM GOALS: Target date:  8 weeks =  09-28-23  Pt will perform x1 viewing exercise - both horizontal and vertical directions for 60 secs with c/o dizziness </= 3/10 upon completion of exercise. Baseline: 8/10 on 08-06-23;  2-3/10 on 09-11-23 Goal status: Goal met 09-11-23  2.  Improve DHI score by at least 20 points to improve dizziness for improved quality of life. Baseline:  TBA  Goal status: Deferred  3.  Subjectively report at least 50% improvement in  dizziness. Baseline:  Goal status: Goal met  09-11-23  4.  Modified independent ambulation without device - household and short community distance (</= 300') Baseline: pt using SPC for assistance with amb.  Goal status: Goal met 09-11-23   5.  Maintain balance on mCTSIB conditions 3 and 4 for at least 30 secs without LOB to demo improved vestibular input in maintaining balance. Baseline: pt stood on floor with EC for 30 secs with mild postural sway with c/o dizziness; 30 secs on 09-11-23 conditions 3 and 4 without LOB Goal status: Goal met 09-11-23  6.  Independent in updated HEP for vestibular exercises. Baseline:  Goal status: Goal met 09-11-23  ASSESSMENT:  CLINICAL IMPRESSION: Pt has met  LTG's #1, 3-6;  LTG # 2 deferred as DHI was not completed.  Pt reports much improvement in dizziness with etiology still unclear.  Pt continues to use [facility] for assistance with community ambulation but appears to use this device for security and confidence as pt is able to ambulate safely without this device.  Pt states she is ready for discharge and plans to return to work on 09-19-23.    OBJECTIVE IMPAIRMENTS: decreased balance, decreased mobility, difficulty walking, dizziness, and impaired sensation.   ACTIVITY LIMITATIONS: carrying, squatting, and locomotion level  PARTICIPATION LIMITATIONS: cleaning, laundry, driving, shopping, and community activity  PERSONAL FACTORS: Transportation and 1 comorbidity: dizziness of unknown etiology/paresthesias Lt side of face  are also affecting patient's functional outcome.   REHAB POTENTIAL: Good  CLINICAL DECISION MAKING: Evolving/moderate complexity  EVALUATION COMPLEXITY: Moderate   PLAN:  PT FREQUENCY: 2x/week (2x/week recommended but pt states she may only be able to do 1 appt/week due to lack of transportation)  PT DURATION: 8 weeks  PLANNED INTERVENTIONS: 97110-Therapeutic exercises, 97530- Therapeutic activity, [id number]- Neuromuscular re-education,  97535- Self Care, [id number]- Gait training, and [phone]- Canalith repositioning  PLAN FOR NEXT SESSION:  D/C on 09-11-23   PHYSICAL THERAPY DISCHARGE SUMMARY  Visits from Start of Care: 5  Current functional level related to goals / functional outcomes: See above for progress towards goals   Remaining deficits: Continued c/o mild dizziness; decreased high level balance and gait especially with increased vestibular input required    Education / Equipment: Pt has been instructed in HEP for vestibular exercises and for gaze stabilization.   Patient agrees to discharge. Patient goals were met. Patient is being discharged due to meeting the stated rehab goals.    Dorsie Gaunt, PT 09/12/2023, 1:12 PM

## 2023-09-12 ENCOUNTER — Encounter: Payer: Self-pay | Admitting: Physical Therapy

## 2023-09-14 ENCOUNTER — Other Ambulatory Visit: Payer: Self-pay | Admitting: Family Medicine

## 2023-09-14 DIAGNOSIS — M8588 Other specified disorders of bone density and structure, other site: Secondary | ICD-10-CM

## 2023-09-17 ENCOUNTER — Other Ambulatory Visit: Payer: Self-pay | Admitting: Family Medicine

## 2023-09-17 DIAGNOSIS — Z1231 Encounter for screening mammogram for malignant neoplasm of breast: Secondary | ICD-10-CM

## 2023-09-18 ENCOUNTER — Ambulatory Visit
Admission: RE | Admit: 2023-09-18 | Discharge: 2023-09-18 | Discharge status: Home / Self Care | Source: Ambulatory Visit | Attending: Family Medicine | Admitting: Family Medicine

## 2023-09-18 DIAGNOSIS — Z1231 Encounter for screening mammogram for malignant neoplasm of breast: Secondary | ICD-10-CM

## 2023-09-20 ENCOUNTER — Other Ambulatory Visit: Payer: Self-pay | Admitting: Family Medicine

## 2023-09-20 DIAGNOSIS — Z1231 Encounter for screening mammogram for malignant neoplasm of breast: Secondary | ICD-10-CM

## 2023-09-21 ENCOUNTER — Ambulatory Visit
Admission: RE | Admit: 2023-09-21 | Discharge: 2023-09-21 | Disposition: A | Discharge status: Home / Self Care | Source: Ambulatory Visit | Attending: Family Medicine | Admitting: Family Medicine

## 2023-09-21 DIAGNOSIS — M8588 Other specified disorders of bone density and structure, other site: Secondary | ICD-10-CM
